# Patient Record
Sex: Female | Born: 1960 | Hispanic: No | Marital: Married | Smoking: Never smoker
Health system: Southern US, Community
[De-identification: ages and names within clinical notes are randomized; demographics above are authoritative.]

## PROBLEM LIST (undated history)

## (undated) DIAGNOSIS — I10 Essential (primary) hypertension: Secondary | ICD-10-CM

## (undated) HISTORY — DX: Essential (primary) hypertension: I10

## (undated) HISTORY — PX: DILATION AND CURETTAGE OF UTERUS: SHX78

---

## 1999-04-22 ENCOUNTER — Other Ambulatory Visit: Admission: RE | Admit: 1999-04-22 | Discharge: 1999-04-22 | Payer: Self-pay | Admitting: Obstetrics and Gynecology

## 1999-09-22 ENCOUNTER — Encounter (INDEPENDENT_AMBULATORY_CARE_PROVIDER_SITE_OTHER): Payer: Self-pay | Admitting: *Deleted

## 1999-09-22 ENCOUNTER — Ambulatory Visit (HOSPITAL_COMMUNITY): Admission: RE | Admit: 1999-09-22 | Discharge: 1999-09-22 | Payer: Self-pay | Admitting: General Surgery

## 2001-05-24 ENCOUNTER — Other Ambulatory Visit: Admission: RE | Admit: 2001-05-24 | Discharge: 2001-05-24 | Payer: Self-pay | Admitting: Obstetrics and Gynecology

## 2002-05-02 ENCOUNTER — Encounter: Payer: Self-pay | Admitting: Obstetrics and Gynecology

## 2002-05-02 ENCOUNTER — Encounter: Admission: RE | Admit: 2002-05-02 | Discharge: 2002-05-02 | Payer: Self-pay | Admitting: Obstetrics and Gynecology

## 2003-08-17 ENCOUNTER — Other Ambulatory Visit: Admission: RE | Admit: 2003-08-17 | Discharge: 2003-08-17 | Payer: Self-pay | Admitting: Obstetrics and Gynecology

## 2004-05-01 ENCOUNTER — Emergency Department (HOSPITAL_COMMUNITY): Admission: EM | Admit: 2004-05-01 | Discharge: 2004-05-01 | Payer: Self-pay | Admitting: Family Medicine

## 2005-05-01 ENCOUNTER — Other Ambulatory Visit: Admission: RE | Admit: 2005-05-01 | Discharge: 2005-05-01 | Payer: Self-pay | Admitting: Obstetrics and Gynecology

## 2008-10-27 ENCOUNTER — Ambulatory Visit: Payer: Self-pay | Admitting: Internal Medicine

## 2008-11-03 ENCOUNTER — Ambulatory Visit: Payer: Self-pay | Admitting: Internal Medicine

## 2009-02-23 ENCOUNTER — Ambulatory Visit: Payer: Self-pay | Admitting: Internal Medicine

## 2009-02-24 ENCOUNTER — Ambulatory Visit: Payer: Self-pay | Admitting: Internal Medicine

## 2010-10-21 ENCOUNTER — Ambulatory Visit
Admission: RE | Admit: 2010-10-21 | Discharge: 2010-10-21 | Payer: Self-pay | Source: Home / Self Care | Attending: Internal Medicine | Admitting: Internal Medicine

## 2010-10-21 ENCOUNTER — Ambulatory Visit: Admit: 2010-10-21 | Payer: Self-pay | Admitting: Internal Medicine

## 2011-04-19 ENCOUNTER — Encounter: Payer: Self-pay | Admitting: Internal Medicine

## 2011-04-21 ENCOUNTER — Encounter: Payer: Self-pay | Admitting: Internal Medicine

## 2011-04-22 NOTE — Progress Notes (Signed)
  Subjective:    Patient ID: Deanna Randolph, female    DOB: Sep 22, 1961, 50 y.o.   MRN: [id number]  HPI    Review of Systems     Objective:   Physical Exam        Assessment & Plan:

## 2011-04-25 ENCOUNTER — Ambulatory Visit: Payer: Self-pay | Admitting: Internal Medicine

## 2011-08-08 ENCOUNTER — Other Ambulatory Visit: Payer: Self-pay | Admitting: Orthopedic Surgery

## 2011-08-08 DIAGNOSIS — M25561 Pain in right knee: Secondary | ICD-10-CM

## 2011-08-12 ENCOUNTER — Ambulatory Visit
Admission: RE | Admit: 2011-08-12 | Discharge: 2011-08-12 | Disposition: A | Discharge status: Home / Self Care | Payer: BC Managed Care – PPO | Source: Ambulatory Visit | Attending: Orthopedic Surgery | Admitting: Orthopedic Surgery

## 2011-08-12 DIAGNOSIS — M25561 Pain in right knee: Secondary | ICD-10-CM

## 2011-08-14 ENCOUNTER — Encounter (HOSPITAL_COMMUNITY): Admission: RE | Payer: Self-pay | Source: Ambulatory Visit

## 2011-08-14 ENCOUNTER — Inpatient Hospital Stay (HOSPITAL_COMMUNITY): Admission: RE | Admit: 2011-08-14 | Payer: Self-pay | Source: Ambulatory Visit | Admitting: Obstetrics and Gynecology

## 2011-08-14 SURGERY — HYSTERECTOMY, ABDOMINAL
Anesthesia: Choice

## 2011-09-16 ENCOUNTER — Other Ambulatory Visit: Payer: Self-pay | Admitting: Internal Medicine

## 2011-11-10 ENCOUNTER — Telehealth: Payer: Self-pay | Admitting: Internal Medicine

## 2011-11-10 MED ORDER — CYCLOBENZAPRINE HCL 10 MG PO TABS: 10.0000 mg | ORAL_TABLET | Freq: Three times a day (TID) | ORAL | Status: DC | PRN

## 2011-11-10 NOTE — Telephone Encounter (Signed)
Call in generic Flexeril 10mg  #30 with directions 1/2-1po tid prn with no refill.

## 2011-11-10 NOTE — Telephone Encounter (Signed)
Rx for Flexeril sent to patient's pharmacy. messagee left for her

## 2011-12-12 ENCOUNTER — Other Ambulatory Visit: Payer: Self-pay

## 2011-12-12 MED ORDER — RAMIPRIL 5 MG PO CAPS: 5.0000 mg | ORAL_CAPSULE | Freq: Every day | ORAL | Status: DC

## 2012-01-01 ENCOUNTER — Telehealth: Payer: Self-pay | Admitting: Internal Medicine

## 2012-01-01 DIAGNOSIS — I10 Essential (primary) hypertension: Secondary | ICD-10-CM

## 2012-01-01 NOTE — Telephone Encounter (Signed)
Patient called last evening around 6 PM. Around 1 PM she ate a meal it [employer] consisting of chicken & a glass of wine. Shortly thereafter within 30 minutes or so she felt epigastric pain. Did not vomit. Initially was not nauseated. Husband who is physician gave her Prilosec, Pepto-Bismol, ginger ale. She vomited the Pepto-Bismol and ginger ale. Pain was intermittent. At times, she felt well enough to walk her dog and then was pain would return. No diarrhea. Pain was not in right upper quadrant but was described to me as epigastric. Seemed  like something fairly acute in onset after eating according to her husband. Patient said she was in a lot of distress when she called. I recommended that she go to urgent care to get evaluated. Patient denies being constipated. Had no fever or chills. We will call her today and see how she is doing. Addendum: We called and left a message. No answer at home.

## 2012-04-17 ENCOUNTER — Ambulatory Visit (HOSPITAL_COMMUNITY): Payer: BC Managed Care – PPO | Admitting: Anesthesiology

## 2012-04-17 ENCOUNTER — Ambulatory Visit (HOSPITAL_COMMUNITY)
Admission: RE | Admit: 2012-04-17 | Discharge: 2012-04-17 | Disposition: A | Discharge status: Home / Self Care | Payer: BC Managed Care – PPO | Source: Ambulatory Visit | Attending: Obstetrics and Gynecology | Admitting: Obstetrics and Gynecology

## 2012-04-17 ENCOUNTER — Encounter (HOSPITAL_COMMUNITY): Payer: Self-pay | Admitting: *Deleted

## 2012-04-17 ENCOUNTER — Encounter (HOSPITAL_COMMUNITY): Payer: Self-pay | Admitting: Anesthesiology

## 2012-04-17 ENCOUNTER — Encounter (HOSPITAL_COMMUNITY)
Admission: RE | Disposition: A | Discharge status: Home / Self Care | Payer: Self-pay | Source: Ambulatory Visit | Attending: Obstetrics and Gynecology

## 2012-04-17 DIAGNOSIS — I10 Essential (primary) hypertension: Secondary | ICD-10-CM

## 2012-04-17 DIAGNOSIS — N938 Other specified abnormal uterine and vaginal bleeding: Secondary | ICD-10-CM | POA: Insufficient documentation

## 2012-04-17 DIAGNOSIS — N949 Unspecified condition associated with female genital organs and menstrual cycle: Secondary | ICD-10-CM | POA: Insufficient documentation

## 2012-04-17 HISTORY — PX: DILATION AND CURETTAGE OF UTERUS: SHX78

## 2012-04-17 LAB — CBC
HCT: 36.7 % (ref 36.0–46.0)
Hemoglobin: 12.1 g/dL (ref 12.0–15.0)
MCV: 85 fL (ref 78.0–100.0)
RBC: 4.32 MIL/uL (ref 3.87–5.11)
RDW: 12.9 % (ref 11.5–15.5)
WBC: 10.1 10*3/uL (ref 4.0–10.5)

## 2012-04-17 SURGERY — DILATION AND CURETTAGE
Anesthesia: Monitor Anesthesia Care | Site: Vagina | Wound class: Clean Contaminated

## 2012-04-17 MED ORDER — FENTANYL CITRATE 0.05 MG/ML IJ SOLN
INTRAMUSCULAR | Status: DC | PRN
  Administered 2012-04-17: 100 ug via INTRAVENOUS

## 2012-04-17 MED ORDER — MEPERIDINE HCL 25 MG/ML IJ SOLN: 6.2500 mg | INTRAMUSCULAR | Status: DC | PRN

## 2012-04-17 MED ORDER — LACTATED RINGERS IV SOLN
INTRAVENOUS | Status: DC
  Administered 2012-04-17 (×2): via INTRAVENOUS

## 2012-04-17 MED ORDER — MIDAZOLAM HCL 5 MG/5ML IJ SOLN
INTRAMUSCULAR | Status: DC | PRN
  Administered 2012-04-17: 2 mg via INTRAVENOUS

## 2012-04-17 MED ORDER — CEFAZOLIN SODIUM 1-5 GM-% IV SOLN
1.0000 g | INTRAVENOUS | Status: AC
  Administered 2012-04-17: 1 g via INTRAVENOUS

## 2012-04-17 MED ORDER — ONDANSETRON HCL 4 MG/2ML IJ SOLN: 4.0000 mg | Freq: Once | INTRAMUSCULAR | Status: DC | PRN

## 2012-04-17 MED ORDER — PROPOFOL 10 MG/ML IV EMUL
INTRAVENOUS | Status: DC | PRN
  Administered 2012-04-17: 100 ug/kg/min via INTRAVENOUS

## 2012-04-17 MED ORDER — LIDOCAINE HCL 1 % IJ SOLN
INTRAMUSCULAR | Status: DC | PRN
  Administered 2012-04-17: 10 mL

## 2012-04-17 MED ORDER — KETOROLAC TROMETHAMINE 30 MG/ML IJ SOLN: 15.0000 mg | Freq: Once | INTRAMUSCULAR | Status: DC | PRN

## 2012-04-17 MED ORDER — KETOROLAC TROMETHAMINE 30 MG/ML IJ SOLN
INTRAMUSCULAR | Status: DC | PRN
  Administered 2012-04-17: 30 mg via INTRAVENOUS

## 2012-04-17 MED ORDER — FENTANYL CITRATE 0.05 MG/ML IJ SOLN
25.0000 ug | INTRAMUSCULAR | Status: DC | PRN
  Administered 2012-04-17: 50 ug via INTRAVENOUS

## 2012-04-17 MED ORDER — ONDANSETRON HCL 4 MG/2ML IJ SOLN
INTRAMUSCULAR | Status: DC | PRN
  Administered 2012-04-17: 4 mg via INTRAVENOUS

## 2012-04-17 MED ORDER — PROPOFOL 10 MG/ML IV EMUL
INTRAVENOUS | Status: DC | PRN
  Administered 2012-04-17 (×2): 20 mg via INTRAVENOUS
  Administered 2012-04-17: 50 mg via INTRAVENOUS

## 2012-04-17 MED ORDER — LIDOCAINE HCL (CARDIAC) 20 MG/ML IV SOLN
INTRAVENOUS | Status: DC | PRN
  Administered 2012-04-17: 60 mg via INTRAVENOUS

## 2012-04-17 SURGICAL SUPPLY — 11 items
CATH ROBINSON RED A/P 16FR (CATHETERS) ×2 IMPLANT
CLOTH BEACON ORANGE TIMEOUT ST (SAFETY) ×2 IMPLANT
CONT PATH 16OZ SNAP LID 3702 (MISCELLANEOUS) ×2 IMPLANT
GLOVE BIO SURGEON STRL SZ 6.5 (GLOVE) ×4 IMPLANT
GOWN PREVENTION PLUS LG XLONG (DISPOSABLE) ×4 IMPLANT
NEEDLE SPNL 22GX3.5 QUINCKE BK (NEEDLE) ×2 IMPLANT
PACK VAGINAL MINOR WOMEN LF (CUSTOM PROCEDURE TRAY) ×2 IMPLANT
PAD PREP 24X48 CUFFED NSTRL (MISCELLANEOUS) ×2 IMPLANT
SYR CONTROL 10ML LL (SYRINGE) ×2 IMPLANT
TOWEL OR 17X24 6PK STRL BLUE (TOWEL DISPOSABLE) ×4 IMPLANT
WATER STERILE IRR 1000ML POUR (IV SOLUTION) ×2 IMPLANT

## 2012-04-17 NOTE — Transfer of Care (Signed)
Immediate Anesthesia Transfer of Care Note  Patient: Deanna Randolph  Procedure(s) Performed: Procedure(s) (LRB): DILATATION AND CURETTAGE (N/A)  Patient Location: PACU  Anesthesia Type: MAC  Level of Consciousness: awake  Airway & Oxygen Therapy: Patient Spontanous Breathing and Patient connected to nasal cannula oxygen  Post-op Assessment: Report given to PACU RN and Post -op Vital signs reviewed and stable  Post vital signs: stable  Complications: No apparent anesthesia complications

## 2012-04-17 NOTE — Anesthesia Postprocedure Evaluation (Signed)
Anesthesia Post Note  Patient: Deanna Randolph  Procedure(s) Performed: Procedure(s) (LRB): DILATATION AND CURETTAGE (N/A)  Anesthesia type: MAC  Patient location: PACU  Post pain: Pain level controlled  Post assessment: Post-op Vital signs reviewed  Last Vitals:  Filed Vitals:   04/17/12 1338  BP: 149/73  Pulse: 72  Temp: 37.1 C  Resp: 16    Post vital signs: Reviewed  Level of consciousness: sedated  Complications: No apparent anesthesia complications

## 2012-04-17 NOTE — Anesthesia Preprocedure Evaluation (Signed)
Anesthesia Evaluation  Patient identified by MRN, date of birth, ID band Patient awake    Reviewed: Allergy & Precautions, H&P , NPO status , Patient's Chart, lab work & pertinent test results  Airway Mallampati: III TM Distance: >3 FB Neck ROM: full    Dental No notable dental hx. (+) Teeth Intact   Pulmonary neg pulmonary ROS,    Pulmonary exam normal       Cardiovascular     Neuro/Psych negative neurological ROS  negative psych ROS   GI/Hepatic negative GI ROS, Neg liver ROS,   Endo/Other  negative endocrine ROS  Renal/GU negative Renal ROS  negative genitourinary   Musculoskeletal negative musculoskeletal ROS (+)   Abdominal Normal abdominal exam  (+)   Peds negative pediatric ROS (+)  Hematology negative hematology ROS (+)   Anesthesia Other Findings   Reproductive/Obstetrics negative OB ROS                           Anesthesia Physical Anesthesia Plan  ASA: II  Anesthesia Plan: MAC   Post-op Pain Management:    Induction: Intravenous  Airway Management Planned:   Additional Equipment:   Intra-op Plan:   Post-operative Plan:   Informed Consent: I have reviewed the patients History and Physical, chart, labs and discussed the procedure including the risks, benefits and alternatives for the proposed anesthesia with the patient or authorized representative who has indicated his/her understanding and acceptance.     Plan Discussed with: CRNA and Surgeon  Anesthesia Plan Comments:         Anesthesia Quick Evaluation

## 2012-04-17 NOTE — Anesthesia Procedure Notes (Signed)
Epidural Patient location during procedure: OB Start time: 04/17/2012 2:58 PM End time: 04/17/2012 3:02 PM Reason for block: procedure for pain  Staffing Anesthesiologist: Sandrea Hughs Performed by: anesthesiologist   Preanesthetic Checklist Completed: patient identified, site marked, surgical consent, pre-op evaluation, timeout performed, IV checked, risks and benefits discussed and monitors and equipment checked  Epidural Patient position: sitting Prep: site prepped and draped and DuraPrep Patient monitoring: continuous pulse ox and blood pressure Approach: midline Injection technique: LOR air  Needle:  Needle type: Tuohy  Needle gauge: 17 G Needle length: 9 cm Needle insertion depth: 6 cm Catheter type: closed end flexible Catheter size: 19 Gauge Catheter at skin depth: 11 cm Test dose: negative and Other  Assessment Sensory level: T8 Events: blood not aspirated, injection not painful, no injection resistance, negative IV test and no paresthesia

## 2012-04-17 NOTE — H&P (Signed)
51 year old female here for emergent D and C. She started having bleeding over a week ago and last night woke up in a puddle of blood. Passing large amount of clots. Unable to perform ultrasound in office today because of discomfort. Patient has not had a cycle since approximately November.  Med Hx Hypertension  Afebrile Vital Signs Stable General alert and oriented Lung CTAB Car RRR Pelvic heavy bleeding with clots in vagina  Hemoglobin in office 11.8  IMPRESSION: Abnormal uterine bleeding  PLAN: D and C Risks reviewed with patient

## 2012-04-17 NOTE — Op Note (Signed)
Deanna Randolph, HEBDON NO.:  [id number]  MEDICAL RECORD NO.:  [id number]  LOCATION:  [facility]                          FACILITY:  [facility]  PHYSICIAN:  Herman Fiero L. Omauri Boeve, M.D.DATE OF BIRTH:  1961-06-25  DATE OF PROCEDURE:  04/17/2012 DATE OF DISCHARGE:  04/17/2012                              OPERATIVE REPORT   PREOPERATIVE DIAGNOSIS:  Abnormal uterine bleeding.  POSTOP DIAGNOSIS:  Abnormal uterine bleeding.  PROCEDURE:  D and C.  SURGEON:  Kalev Temme L. Lovell Nuttall, MD  ANESTHESIA:  IV sedation with MAC with paracervical block.  COMPLICATIONS:  None.  EBL:  Minimal.  PROCEDURE:  The patient was consented and taken to the operating room. She was prepped and draped.  In and out catheter was used to empty the bladder.  The speculum was then turned to the vagina.  The cervix was grasped with a tenaculum.  There was a moderate amount of blood in the vagina.  Paracervical block was performed in standard fashion and the cervix was gently dilated using Pratt dilators.  A sharp curette was inserted.  The uterus was thoroughly curetted of tissue which was moderate in amount with some clots.  All tissue was sent to pathology. At the end of the procedure, minimal bleeding was noted.  All sponges were removed from the vagina.  All instrument counts were correct x2. The patient went to recovery room in stable condition, and she will be called with the pathology in a few days.     Acheron Sugg L. Vincente Poli, M.D.     Florestine Avers  D:  04/17/2012  T:  04/17/2012  Job:  [id number]

## 2012-04-17 NOTE — Discharge Instructions (Signed)

## 2012-04-17 NOTE — Brief Op Note (Signed)
04/17/2012  2:50 PM  PATIENT:  Deanna Randolph  51 y.o. female  PRE-OPERATIVE DIAGNOSIS:  Abnormal uterine bleeding  POST-OPERATIVE DIAGNOSIS:  same  PROCEDURE:  Procedure(s) (LRB): DILATATION AND CURETTAGE (N/A)  SURGEON:  Surgeon(s) and Role:    * Jeani Hawking, MD - Primary  PHYSICIAN ASSISTANT:   ASSISTANTS: none   ANESTHESIA:   local and IV sedation  EBL:  Total I/O In: 500 [I.V.:500] Out: -   BLOOD ADMINISTERED:none  DRAINS: none   LOCAL MEDICATIONS USED:  LIDOCAINE   SPECIMEN:  Source of Specimen:  uterine curettings  DISPOSITION OF SPECIMEN:  PATHOLOGY  COUNTS:  YES  TOURNIQUET:  * No tourniquets in log *  DICTATION: .Other Dictation: Dictation Number [id number]  PLAN OF CARE: Discharge to home after PACU  PATIENT DISPOSITION:  PACU - hemodynamically stable.   Delay start of Pharmacological VTE agent (>24hrs) due to surgical blood loss or risk of bleeding: not applicable

## 2012-04-18 ENCOUNTER — Encounter (HOSPITAL_COMMUNITY): Payer: Self-pay | Admitting: Obstetrics and Gynecology

## 2012-05-22 ENCOUNTER — Encounter (HOSPITAL_COMMUNITY): Payer: Self-pay | Admitting: Pharmacist

## 2012-05-29 ENCOUNTER — Encounter (HOSPITAL_COMMUNITY): Payer: Self-pay

## 2012-05-29 ENCOUNTER — Encounter (HOSPITAL_COMMUNITY)
Admission: RE | Admit: 2012-05-29 | Discharge: 2012-05-29 | Disposition: A | Discharge status: Home / Self Care | Payer: BC Managed Care – PPO | Source: Ambulatory Visit | Attending: Obstetrics and Gynecology | Admitting: Obstetrics and Gynecology

## 2012-05-29 LAB — BASIC METABOLIC PANEL
Chloride: 102 mEq/L (ref 96–112)
GFR calc Af Amer: >90 mL/min (ref >90)
Potassium: 4.4 mEq/L (ref 3.5–5.1)

## 2012-05-29 LAB — CBC
Platelets: 236 10*3/uL (ref 150–400)
RBC: 4.31 MIL/uL (ref 3.87–5.11)
RDW: 12.7 % (ref 11.5–15.5)
WBC: 6.6 10*3/uL (ref 4.0–10.5)

## 2012-05-29 LAB — SURGICAL PCR SCREEN
MRSA, PCR: NEGATIVE
Staphylococcus aureus: NEGATIVE

## 2012-05-29 NOTE — Patient Instructions (Addendum)
20 Deanna Randolph  05/29/2012   Your procedure is scheduled on:  06/03/12  Enter through the Main Entrance of [facility] at 6 AM.  Pick up the phone at the desk and dial [phone].   Call this number if you have problems the morning of surgery: [phone]   Remember:   Do not eat food:After Midnight.  Do not drink clear liquids: After Midnight.  Take these medicines the morning of surgery with A SIP OF WATER: Blood pressure medication   Do not wear jewelry, make-up or nail polish.  Do not wear lotions, powders, or perfumes. You may wear deodorant.  Do not shave 48 hours prior to surgery.  Do not bring valuables to the hospital.  Contacts, dentures or bridgework may not be worn into surgery.  Leave suitcase in the car. After surgery it may be brought to your room.  For patients admitted to the hospital, checkout time is 11:00 AM the day of discharge.   Patients discharged the day of surgery will not be allowed to drive home.  Name and phone number of your driver: NA  Special Instructions: CHG Shower Use Special Wash: 1/2 bottle night before surgery and 1/2 bottle morning of surgery.   Please read over the following fact sheets that you were given: MRSA Information

## 2012-06-02 NOTE — H&P (Signed)
51 year old G 1 P 1 with abnormal uterine bleeding here for TAHBSO.  She had an episode of heavy vaginal bleeding requiring emergency D and C. Pathology was benign. Vaginal bleeding returned and responded to Lysteda.  Medical history Hypertension  Surgical history D and C  Meds  Antihypertensive  NKDA  Social history  no tobacco or alcohol  Afebrile Vital signs stable General alert and oriented Lung CTAB Car RRR Pelvic Is normal  Impression Abnormal uterine bleeding  Plan TAHBSO

## 2012-06-03 ENCOUNTER — Inpatient Hospital Stay (HOSPITAL_COMMUNITY): Payer: BC Managed Care – PPO | Admitting: Anesthesiology

## 2012-06-03 ENCOUNTER — Encounter (HOSPITAL_COMMUNITY): Payer: Self-pay | Admitting: Anesthesiology

## 2012-06-03 ENCOUNTER — Encounter (HOSPITAL_COMMUNITY)
Admission: RE | Disposition: A | Discharge status: Home / Self Care | Payer: Self-pay | Source: Ambulatory Visit | Attending: Obstetrics and Gynecology

## 2012-06-03 ENCOUNTER — Inpatient Hospital Stay (HOSPITAL_COMMUNITY)
Admission: RE | Admit: 2012-06-03 | Discharge: 2012-06-05 | DRG: 359 | Disposition: A | Discharge status: Home / Self Care | Payer: BC Managed Care – PPO | Source: Ambulatory Visit | Attending: Obstetrics and Gynecology | Admitting: Obstetrics and Gynecology

## 2012-06-03 ENCOUNTER — Inpatient Hospital Stay (HOSPITAL_COMMUNITY)
Admission: RE | Admit: 2012-06-03 | Payer: BC Managed Care – PPO | Source: Ambulatory Visit | Admitting: Obstetrics and Gynecology

## 2012-06-03 ENCOUNTER — Encounter (HOSPITAL_COMMUNITY): Admission: RE | Payer: Self-pay | Source: Ambulatory Visit

## 2012-06-03 ENCOUNTER — Encounter (HOSPITAL_COMMUNITY): Payer: Self-pay

## 2012-06-03 DIAGNOSIS — D251 Intramural leiomyoma of uterus: Secondary | ICD-10-CM | POA: Diagnosis present

## 2012-06-03 DIAGNOSIS — N949 Unspecified condition associated with female genital organs and menstrual cycle: Principal | ICD-10-CM | POA: Diagnosis present

## 2012-06-03 DIAGNOSIS — N803 Endometriosis of pelvic peritoneum, unspecified: Secondary | ICD-10-CM | POA: Diagnosis present

## 2012-06-03 DIAGNOSIS — N809 Endometriosis, unspecified: Secondary | ICD-10-CM

## 2012-06-03 DIAGNOSIS — N801 Endometriosis of ovary: Secondary | ICD-10-CM | POA: Diagnosis present

## 2012-06-03 DIAGNOSIS — N80109 Endometriosis of ovary, unspecified side, unspecified depth: Secondary | ICD-10-CM | POA: Diagnosis present

## 2012-06-03 DIAGNOSIS — N938 Other specified abnormal uterine and vaginal bleeding: Principal | ICD-10-CM | POA: Diagnosis present

## 2012-06-03 DIAGNOSIS — I10 Essential (primary) hypertension: Secondary | ICD-10-CM

## 2012-06-03 HISTORY — PX: SALPINGOOPHORECTOMY: SHX82

## 2012-06-03 HISTORY — PX: ABDOMINAL HYSTERECTOMY: SHX81

## 2012-06-03 LAB — CBC
HCT: 31.8 % — ABNORMAL LOW (ref 36.0–46.0)
Hemoglobin: 10.4 g/dL — ABNORMAL LOW (ref 12.0–15.0)
MCV: 85.5 fL (ref 78.0–100.0)
RDW: 12.8 % (ref 11.5–15.5)
WBC: 16.9 10*3/uL — ABNORMAL HIGH (ref 4.0–10.5)

## 2012-06-03 LAB — BASIC METABOLIC PANEL
BUN: 8 mg/dL (ref 6–23)
CO2: 27 mEq/L (ref 19–32)
Chloride: 104 mEq/L (ref 96–112)
Creatinine, Ser: 0.57 mg/dL (ref 0.50–1.10)
Glucose, Bld: 156 mg/dL — ABNORMAL HIGH (ref 70–99)
Potassium: 3.7 mEq/L (ref 3.5–5.1)

## 2012-06-03 SURGERY — HYSTERECTOMY, ABDOMINAL
Anesthesia: Choice

## 2012-06-03 SURGERY — HYSTERECTOMY, ABDOMINAL
Anesthesia: General | Site: Abdomen | Laterality: Bilateral

## 2012-06-03 MED ORDER — FENTANYL CITRATE 0.05 MG/ML IJ SOLN
25.0000 ug | INTRAMUSCULAR | Status: DC | PRN
  Administered 2012-06-03 (×2): 50 ug via INTRAVENOUS

## 2012-06-03 MED ORDER — FENTANYL CITRATE 0.05 MG/ML IJ SOLN
INTRAMUSCULAR | Status: AC
  Filled 2012-06-03: qty 5

## 2012-06-03 MED ORDER — HYDROMORPHONE HCL PF 1 MG/ML IJ SOLN
INTRAMUSCULAR | Status: AC
  Administered 2012-06-03: 0.5 mg via INTRAVENOUS
  Filled 2012-06-03: qty 1

## 2012-06-03 MED ORDER — ONDANSETRON HCL 4 MG/2ML IJ SOLN
INTRAMUSCULAR | Status: AC
  Filled 2012-06-03: qty 2

## 2012-06-03 MED ORDER — IBUPROFEN 600 MG PO TABS
600.0000 mg | ORAL_TABLET | Freq: Four times a day (QID) | ORAL | Status: DC | PRN
  Administered 2012-06-04 – 2012-06-05 (×4): 600 mg via ORAL
  Filled 2012-06-03 (×4): qty 1

## 2012-06-03 MED ORDER — FENTANYL CITRATE 0.05 MG/ML IJ SOLN
INTRAMUSCULAR | Status: AC
  Administered 2012-06-03: 50 ug via INTRAVENOUS
  Filled 2012-06-03: qty 2

## 2012-06-03 MED ORDER — CEFAZOLIN SODIUM-DEXTROSE 2-3 GM-% IV SOLR
2.0000 g | INTRAVENOUS | Status: AC
  Administered 2012-06-03: 2 g via INTRAVENOUS

## 2012-06-03 MED ORDER — DIPHENHYDRAMINE HCL 50 MG/ML IJ SOLN: 12.5000 mg | Freq: Four times a day (QID) | INTRAMUSCULAR | Status: DC | PRN

## 2012-06-03 MED ORDER — RAMIPRIL 5 MG PO CAPS
5.0000 mg | ORAL_CAPSULE | Freq: Every day | ORAL | Status: DC
  Administered 2012-06-04 – 2012-06-05 (×2): 5 mg via ORAL
  Filled 2012-06-03 (×3): qty 1

## 2012-06-03 MED ORDER — PROMETHAZINE HCL 25 MG/ML IJ SOLN: 6.2500 mg | INTRAMUSCULAR | Status: DC | PRN

## 2012-06-03 MED ORDER — TEMAZEPAM 15 MG PO CAPS: 15.0000 mg | ORAL_CAPSULE | Freq: Every evening | ORAL | Status: DC | PRN

## 2012-06-03 MED ORDER — DEXAMETHASONE SODIUM PHOSPHATE 4 MG/ML IJ SOLN
INTRAMUSCULAR | Status: DC | PRN
  Administered 2012-06-03: 10 mg via INTRAVENOUS

## 2012-06-03 MED ORDER — PROPOFOL 10 MG/ML IV EMUL
INTRAVENOUS | Status: DC | PRN
  Administered 2012-06-03: 200 mg via INTRAVENOUS

## 2012-06-03 MED ORDER — DIPHENHYDRAMINE HCL 12.5 MG/5ML PO ELIX: 12.5000 mg | ORAL_SOLUTION | Freq: Four times a day (QID) | ORAL | Status: DC | PRN

## 2012-06-03 MED ORDER — ROCURONIUM BROMIDE 100 MG/10ML IV SOLN
INTRAVENOUS | Status: DC | PRN
  Administered 2012-06-03: 40 mg via INTRAVENOUS
  Administered 2012-06-03: 5 mg via INTRAVENOUS

## 2012-06-03 MED ORDER — KETOROLAC TROMETHAMINE 30 MG/ML IJ SOLN: 15.0000 mg | Freq: Once | INTRAMUSCULAR | Status: DC | PRN

## 2012-06-03 MED ORDER — GLYCOPYRROLATE 0.2 MG/ML IJ SOLN
INTRAMUSCULAR | Status: AC
  Filled 2012-06-03: qty 1

## 2012-06-03 MED ORDER — NEOSTIGMINE METHYLSULFATE 1 MG/ML IJ SOLN
INTRAMUSCULAR | Status: DC | PRN
  Administered 2012-06-03: 5 mg via INTRAVENOUS

## 2012-06-03 MED ORDER — HYDROMORPHONE 0.3 MG/ML IV SOLN
INTRAVENOUS | Status: DC
  Administered 2012-06-03: 10:00:00 via INTRAVENOUS
  Administered 2012-06-03: 2.99 mg via INTRAVENOUS
  Administered 2012-06-04: 02:00:00 via INTRAVENOUS
  Filled 2012-06-03 (×2): qty 25

## 2012-06-03 MED ORDER — BUPIVACAINE HCL (PF) 0.25 % IJ SOLN
INTRAMUSCULAR | Status: AC
  Filled 2012-06-03: qty 30

## 2012-06-03 MED ORDER — BUPIVACAINE HCL (PF) 0.25 % IJ SOLN
INTRAMUSCULAR | Status: DC | PRN
  Administered 2012-06-03: 10 mL

## 2012-06-03 MED ORDER — GLYCOPYRROLATE 0.2 MG/ML IJ SOLN
INTRAMUSCULAR | Status: AC
  Filled 2012-06-03: qty 3

## 2012-06-03 MED ORDER — KETOROLAC TROMETHAMINE 30 MG/ML IJ SOLN: 30.0000 mg | Freq: Four times a day (QID) | INTRAMUSCULAR | Status: DC

## 2012-06-03 MED ORDER — LIDOCAINE HCL (CARDIAC) 20 MG/ML IV SOLN
INTRAVENOUS | Status: AC
  Filled 2012-06-03: qty 5

## 2012-06-03 MED ORDER — ONDANSETRON HCL 4 MG/2ML IJ SOLN: 4.0000 mg | Freq: Four times a day (QID) | INTRAMUSCULAR | Status: DC | PRN

## 2012-06-03 MED ORDER — ONDANSETRON HCL 4 MG/2ML IJ SOLN
INTRAMUSCULAR | Status: DC | PRN
  Administered 2012-06-03: 4 mg via INTRAVENOUS

## 2012-06-03 MED ORDER — LIDOCAINE HCL (CARDIAC) 20 MG/ML IV SOLN
INTRAVENOUS | Status: DC | PRN
  Administered 2012-06-03: 10 mg via INTRAVENOUS

## 2012-06-03 MED ORDER — NEOSTIGMINE METHYLSULFATE 1 MG/ML IJ SOLN
INTRAMUSCULAR | Status: AC
  Filled 2012-06-03: qty 10

## 2012-06-03 MED ORDER — MIDAZOLAM HCL 2 MG/2ML IJ SOLN
INTRAMUSCULAR | Status: AC
  Filled 2012-06-03: qty 2

## 2012-06-03 MED ORDER — GLYCOPYRROLATE 0.2 MG/ML IJ SOLN
INTRAMUSCULAR | Status: DC | PRN
  Administered 2012-06-03: 0.2 mg via INTRAVENOUS
  Administered 2012-06-03: 1 mg via INTRAVENOUS

## 2012-06-03 MED ORDER — MEPERIDINE HCL 25 MG/ML IJ SOLN: 6.2500 mg | INTRAMUSCULAR | Status: DC | PRN

## 2012-06-03 MED ORDER — MENTHOL 3 MG MT LOZG: 1.0000 | LOZENGE | OROMUCOSAL | Status: DC | PRN

## 2012-06-03 MED ORDER — LIDOCAINE HCL (CARDIAC) 20 MG/ML IV SOLN: INTRAVENOUS | Status: DC | PRN

## 2012-06-03 MED ORDER — KETOROLAC TROMETHAMINE 30 MG/ML IJ SOLN
INTRAMUSCULAR | Status: AC
  Filled 2012-06-03: qty 1

## 2012-06-03 MED ORDER — KETOROLAC TROMETHAMINE 30 MG/ML IJ SOLN
INTRAMUSCULAR | Status: DC | PRN
  Administered 2012-06-03: 30 mg via INTRAVENOUS

## 2012-06-03 MED ORDER — LACTATED RINGERS IV SOLN
INTRAVENOUS | Status: DC
  Administered 2012-06-03 (×3): via INTRAVENOUS

## 2012-06-03 MED ORDER — HYDROMORPHONE HCL PF 1 MG/ML IJ SOLN
INTRAMUSCULAR | Status: AC
  Administered 2012-06-03: 1 mg
  Filled 2012-06-03: qty 1

## 2012-06-03 MED ORDER — PROPOFOL 10 MG/ML IV EMUL: INTRAVENOUS | Status: DC | PRN

## 2012-06-03 MED ORDER — NALOXONE HCL 0.4 MG/ML IJ SOLN: 0.4000 mg | INTRAMUSCULAR | Status: DC | PRN

## 2012-06-03 MED ORDER — TRAMADOL HCL 50 MG PO TABS: 50.0000 mg | ORAL_TABLET | Freq: Four times a day (QID) | ORAL | Status: DC | PRN

## 2012-06-03 MED ORDER — SODIUM CHLORIDE 0.9 % IJ SOLN
9.0000 mL | INTRAMUSCULAR | Status: DC | PRN
  Administered 2012-06-04: 3 mL via INTRAVENOUS

## 2012-06-03 MED ORDER — CEFAZOLIN SODIUM-DEXTROSE 2-3 GM-% IV SOLR
INTRAVENOUS | Status: AC
  Filled 2012-06-03: qty 50

## 2012-06-03 MED ORDER — DEXAMETHASONE SODIUM PHOSPHATE 10 MG/ML IJ SOLN
INTRAMUSCULAR | Status: AC
  Filled 2012-06-03: qty 1

## 2012-06-03 MED ORDER — ROCURONIUM BROMIDE 50 MG/5ML IV SOLN
INTRAVENOUS | Status: AC
  Filled 2012-06-03: qty 1

## 2012-06-03 MED ORDER — FENTANYL CITRATE 0.05 MG/ML IJ SOLN
INTRAMUSCULAR | Status: DC | PRN
  Administered 2012-06-03: 100 ug via INTRAVENOUS
  Administered 2012-06-03: 50 ug via INTRAVENOUS
  Administered 2012-06-03: 100 ug via INTRAVENOUS
  Administered 2012-06-03: 50 ug via INTRAVENOUS
  Administered 2012-06-03 (×2): 100 ug via INTRAVENOUS

## 2012-06-03 MED ORDER — DEXTROSE IN LACTATED RINGERS 5 % IV SOLN
INTRAVENOUS | Status: DC
  Administered 2012-06-03 (×2): via INTRAVENOUS

## 2012-06-03 MED ORDER — MIDAZOLAM HCL 5 MG/5ML IJ SOLN
INTRAMUSCULAR | Status: DC | PRN
  Administered 2012-06-03: 2 mg via INTRAVENOUS

## 2012-06-03 MED ORDER — PROPOFOL 10 MG/ML IV EMUL
INTRAVENOUS | Status: AC
  Filled 2012-06-03: qty 20

## 2012-06-03 MED ORDER — HYDROMORPHONE HCL PF 1 MG/ML IJ SOLN
0.2500 mg | INTRAMUSCULAR | Status: DC | PRN
  Administered 2012-06-03 (×4): 0.5 mg via INTRAVENOUS

## 2012-06-03 MED ORDER — MIDAZOLAM HCL 2 MG/2ML IJ SOLN: 0.5000 mg | Freq: Once | INTRAMUSCULAR | Status: DC | PRN

## 2012-06-03 SURGICAL SUPPLY — 37 items
ADH SKN CLS APL DERMABOND .7 (GAUZE/BANDAGES/DRESSINGS) ×2
BLADE SURG ROTATE 9660 (MISCELLANEOUS) ×3 IMPLANT
BRR ADH 6X5 SEPRAFILM 1 SHT (MISCELLANEOUS)
CANISTER SUCTION 2500CC (MISCELLANEOUS) ×3 IMPLANT
CHLORAPREP W/TINT 26ML (MISCELLANEOUS) ×3 IMPLANT
CLOTH BEACON ORANGE TIMEOUT ST (SAFETY) ×3 IMPLANT
DECANTER SPIKE VIAL GLASS SM (MISCELLANEOUS) IMPLANT
DERMABOND ADVANCED (GAUZE/BANDAGES/DRESSINGS) ×1
DERMABOND ADVANCED .7 DNX12 (GAUZE/BANDAGES/DRESSINGS) ×2 IMPLANT
GAUZE SPONGE 4X4 16PLY XRAY LF (GAUZE/BANDAGES/DRESSINGS) ×3 IMPLANT
GLOVE BIO SURGEON STRL SZ 6.5 (GLOVE) ×6 IMPLANT
GOWN PREVENTION PLUS LG XLONG (DISPOSABLE) ×9 IMPLANT
NEEDLE HYPO 22GX1.5 SAFETY (NEEDLE) ×3 IMPLANT
NEEDLE HYPO 25X1 1.5 SAFETY (NEEDLE) IMPLANT
NS IRRIG 1000ML POUR BTL (IV SOLUTION) ×3 IMPLANT
PACK ABDOMINAL GYN (CUSTOM PROCEDURE TRAY) ×3 IMPLANT
PAD OB MATERNITY 4.3X12.25 (PERSONAL CARE ITEMS) ×3 IMPLANT
PROTECTOR NERVE ULNAR (MISCELLANEOUS) ×3 IMPLANT
SEPRAFILM MEMBRANE 5X6 (MISCELLANEOUS) IMPLANT
SPONGE LAP 18X18 X RAY DECT (DISPOSABLE) ×3 IMPLANT
STAPLER VISISTAT 35W (STAPLE) ×3 IMPLANT
SUT PDS AB 0 CT 36 (SUTURE) IMPLANT
SUT PDS AB 0 CTX 60 (SUTURE) IMPLANT
SUT PLAIN 2 0 XLH (SUTURE) ×3 IMPLANT
SUT VIC AB 0 CT1 18XCR BRD8 (SUTURE) ×4 IMPLANT
SUT VIC AB 0 CT1 27 (SUTURE) ×8
SUT VIC AB 0 CT1 27XBRD ANBCTR (SUTURE) ×8 IMPLANT
SUT VIC AB 0 CT1 8-18 (SUTURE) ×4
SUT VIC AB 3-0 PS1 18 (SUTURE)
SUT VIC AB 3-0 PS1 18X BRD (SUTURE) IMPLANT
SUT VIC AB 4-0 KS 27 (SUTURE) ×3 IMPLANT
SUT VICRYL 0 TIES 12 18 (SUTURE) ×3 IMPLANT
SYR CONTROL 10ML LL (SYRINGE) IMPLANT
SYRINGE 10CC LL (SYRINGE) ×3 IMPLANT
TOWEL OR 17X24 6PK STRL BLUE (TOWEL DISPOSABLE) ×9 IMPLANT
TRAY FOLEY CATH 14FR (SET/KITS/TRAYS/PACK) ×3 IMPLANT
WATER STERILE IRR 1000ML POUR (IV SOLUTION) ×3 IMPLANT

## 2012-06-03 NOTE — Addendum Note (Signed)
Addendum  created 06/03/12 1413 by Lincoln Brigham, CRNA   Modules edited:Notes Section

## 2012-06-03 NOTE — Anesthesia Postprocedure Evaluation (Signed)
  Anesthesia Post-op Note  Patient: Deanna Randolph  Procedure(s) Performed: Procedure(s) (LRB): HYSTERECTOMY ABDOMINAL (Bilateral) SALPINGO OOPHERECTOMY (Bilateral)  Patient Location: [facility]  Anesthesia Type: General  Level of Consciousness: awake, alert  and oriented  Airway and Oxygen Therapy: Patient Spontanous Breathing  Post-op Pain: mild  Post-op Assessment: Patient's Cardiovascular Status Stable, Respiratory Function Stable, Patent Airway, No signs of Nausea or vomiting and Pain level controlled  Post-op Vital Signs: stable  Complications: No apparent anesthesia complications

## 2012-06-03 NOTE — Transfer of Care (Signed)
Immediate Anesthesia Transfer of Care Note  Patient: Deanna Randolph  Procedure(s) Performed: Procedure(s) (LRB): HYSTERECTOMY ABDOMINAL (Bilateral) SALPINGO OOPHERECTOMY (Bilateral)  Patient Location: PACU  Anesthesia Type: General  Level of Consciousness: awake, alert  and oriented  Airway & Oxygen Therapy: Patient Spontanous Breathing and Patient connected to nasal cannula oxygen  Post-op Assessment: Report given to PACU RN and Post -op Vital signs reviewed and stable  Post vital signs: Reviewed and stable  Complications: No apparent anesthesia complications

## 2012-06-03 NOTE — Brief Op Note (Signed)
06/03/2012  8:41 AM  PATIENT:  Jon Billings  51 y.o. female  PRE-OPERATIVE DIAGNOSIS:  Abnormal uterine bleeding  POST-OPERATIVE DIAGNOSIS:  Above, Moderate Endometriosis   PROCEDURE:  Procedure(s) (LRB): HYSTERECTOMY ABDOMINAL  Bilateral SALPINGO OOPHERECTOMY (Bilateral)  SURGEON:  Surgeon(s) and Role:    * Jeani Hawking, MD - Primary    * Mitchel Honour, DO - Assisting  PHYSICIAN ASSISTANT:   ASSISTANTS: none   ANESTHESIA:   general  EBL:  Total I/O In: 2000 [I.V.:2000] Out: 1150 [Urine:750; Blood:400]  BLOOD ADMINISTERED:none  DRAINS: Urinary Catheter (Foley)   LOCAL MEDICATIONS USED:  MARCAINE     SPECIMEN:  Source of Specimen:  uterus, cervix, tubes and ovaries  DISPOSITION OF SPECIMEN:  PATHOLOGY  COUNTS:  YES  TOURNIQUET:  * No tourniquets in log *  DICTATION: .Other Dictation: Dictation Number [id number]  PLAN OF CARE: Admit to inpatient   PATIENT DISPOSITION:  PACU - hemodynamically stable.   Delay start of Pharmacological VTE agent (>24hrs) due to surgical blood loss or risk of bleeding: not applicable

## 2012-06-03 NOTE — Anesthesia Postprocedure Evaluation (Signed)
  Anesthesia Post-op Note  Patient: Deanna Randolph  Procedure(s) Performed: Procedure(s) (LRB): HYSTERECTOMY ABDOMINAL (Bilateral) SALPINGO OOPHERECTOMY (Bilateral)  Patient is awake and responsive. Pain and nausea are reasonably well controlled. Vital signs are stable and clinically acceptable. Oxygen saturation is clinically acceptable. There are no apparent anesthetic complications at this time. Patient is ready for discharge.

## 2012-06-03 NOTE — Progress Notes (Signed)
History and physical on the chart. No significant changes Will proceed with TAH BSO

## 2012-06-03 NOTE — Anesthesia Procedure Notes (Signed)
Procedure Name: Intubation Date/Time: 06/03/2012 7:34 AM Performed by: Lincoln Brigham Pre-anesthesia Checklist: Patient identified, Timeout performed, Emergency Drugs available, Suction available and Patient being monitored Patient Re-evaluated:Patient Re-evaluated prior to inductionOxygen Delivery Method: Circle system utilized Preoxygenation: Pre-oxygenation with 100% oxygen Intubation Type: IV induction Ventilation: Mask ventilation without difficulty Grade View: Grade III Tube type: Oral Tube size: 7.0 mm Number of attempts: 2 Airway Equipment and Method: Video-laryngoscopy and Stylet Placement Confirmation: ETT inserted through vocal cords under direct vision,  positive ETCO2,  CO2 detector and breath sounds checked- equal and bilateral Secured at: 22 cm Tube secured with: Tape Dental Injury: Teeth and Oropharynx as per pre-operative assessment  Difficulty Due To: Difficulty was unanticipated and Difficult Airway-  due to edematous airway Comments: Easy intubation with Glide Scope

## 2012-06-03 NOTE — Anesthesia Preprocedure Evaluation (Addendum)
Anesthesia Evaluation  Patient identified by MRN, date of birth, ID band Patient awake    Reviewed: Allergy & Precautions, H&P , Patient's Chart, lab work & pertinent test results, reviewed documented beta blocker date and time   History of Anesthesia Complications Negative for: history of anesthetic complications  Airway Mallampati: III TM Distance: >3 FB Neck ROM: full  Mouth opening: Limited Mouth Opening  Dental No notable dental hx.    Pulmonary neg pulmonary ROS,  breath sounds clear to auscultation  Pulmonary exam normal       Cardiovascular Exercise Tolerance: Good hypertension, negative cardio ROS  Rhythm:regular Rate:Normal     Neuro/Psych negative neurological ROS  negative psych ROS   GI/Hepatic negative GI ROS, Neg liver ROS,   Endo/Other  negative endocrine ROS  Renal/GU negative Renal ROS     Musculoskeletal   Abdominal   Peds  Hematology negative hematology ROS (+)   Anesthesia Other Findings   Reproductive/Obstetrics negative OB ROS                          Anesthesia Physical Anesthesia Plan  ASA: II  Anesthesia Plan: General ETT   Post-op Pain Management:    Induction:   Airway Management Planned:   Additional Equipment:   Intra-op Plan:   Post-operative Plan:   Informed Consent: I have reviewed the patients History and Physical, chart, labs and discussed the procedure including the risks, benefits and alternatives for the proposed anesthesia with the patient or authorized representative who has indicated his/her understanding and acceptance.   Dental Advisory Given  Plan Discussed with: CRNA and Surgeon  Anesthesia Plan Comments:         Anesthesia Quick Evaluation

## 2012-06-04 ENCOUNTER — Encounter (HOSPITAL_COMMUNITY): Payer: Self-pay | Admitting: Obstetrics and Gynecology

## 2012-06-04 MED ORDER — DOCUSATE SODIUM 100 MG PO CAPS
100.0000 mg | ORAL_CAPSULE | Freq: Once | ORAL | Status: AC
  Administered 2012-06-04: 100 mg via ORAL
  Filled 2012-06-04: qty 1

## 2012-06-04 MED ORDER — LORATADINE 10 MG PO TABS
10.0000 mg | ORAL_TABLET | Freq: Every day | ORAL | Status: DC
  Administered 2012-06-04: 10 mg via ORAL
  Filled 2012-06-04 (×3): qty 1

## 2012-06-04 MED ORDER — OXYCODONE-ACETAMINOPHEN 5-325 MG PO TABS
2.0000 | ORAL_TABLET | ORAL | Status: DC | PRN
  Administered 2012-06-04 – 2012-06-05 (×6): 1 via ORAL
  Filled 2012-06-04: qty 1
  Filled 2012-06-04: qty 2
  Filled 2012-06-04 (×4): qty 1

## 2012-06-04 NOTE — Op Note (Signed)
Deanna Randolph, SHAWHAN NO.:  [id number]  MEDICAL RECORD NO.:  [id number]  LOCATION:  [facility]                         FACILITY:  [facility]  PHYSICIAN:  Maela Takeda L. Ernestyne Caldwell, M.D.DATE OF BIRTH:  10-07-1961  DATE OF PROCEDURE:  06/03/2012 DATE OF DISCHARGE:                              OPERATIVE REPORT   PREOPERATIVE DIAGNOSIS:  Abnormal uterine bleeding.  POSTOPERATIVE DIAGNOSIS:  Abnormal uterine bleeding and endometriosis, moderate of the pelvis.  SURGEON:  Verenise Moulin L. Vincente Poli, M.D. and Dr. Mitchel Honour.  ANESTHESIA:  General.  EBL:  450-500 mL.  The catheter was then drained through the Foley catheter.  PATHOLOGY:  Uterus, cervix, tubes, and ovaries sent to pathology.  COMPLICATIONS:  None.  PROCEDURE IN DETAIL:  The patient had been consented thoroughly about the risk of the procedure.  She was taken to the operating room, #8. She was intubated.  She was prepped and draped.  A Foley catheter was inserted.  A small Pfannenstiel incision was made at the area of the previous C-section, it was carried down to the fascia.  Fascia scored in the midline, extended laterally.  Rectus muscles were separated in the midline.  The peritoneum was entered bluntly.  The self-retaining retractors were placed in the abdominal cavity.  Exam revealed that she had a slightly enlarged uterus that was retroverted it was slightly adherent to the posterior cul-de-sac.  There was endometriosis behind the uterus.  The ovaries were also stuck down, but we were able to bluntly dissect this free.  There was endometriosis involving the posterior wall of the uterus, the cul-de-sac, and both ovaries.  We started with the hysterectomy with identifying the round ligament on the right side.  Suture ligating that, and then creating an area beneath the IP ligament and placing the clamp just beneath the ovary across the IP ligament.  A careful attention to avoid the ureter, which was deep in the  pelvis.  The pedicle was clamped, cut, and suture ligated using 0 Vicryl suture and then further secured with a suture ligature of 0 Vicryl suture.  We then developed our bladder flap sharply and then identified the uterine artery on the right side.  Clamped the uterine artery at the level of the internal os using a curved Heaney clamp.  The pedicle was then suture ligated using 0 Vicryl suture using a Heaney stitch.  This was done on the left side in a similar fashion.  After we had both mobilized and secured most of the blood flow to the uterus and got down to the internal os.  We then amputated the fundus, and we then placed a series of Kocher clamps across the cervix.  We then developed a bladder flap further and then used straight Heaney clamps to clamp across the uterosacral cardinal ligament complex on either side.  Each pedicle was then clamped, cut, and suture ligated using 0 Vicryl suture. We easily entered the top of the vagina on the left side and then did that on the right side, and removed the specimen, which was cervix, cirrhosis.  Specimen was identified as uterus, tubes, and ovaries, and then the vaginal cuff was closed using 0 Vicryl running locked  stitch. Irrigation was performed.  Hemostasis was very good.  The lap sponges, and self-retaining retractor was removed.  Peritoneum was closed using 0 Vicryl running stitch.  Rectus muscles were reapproximated using 0 Vicryl.  The fascia was closed using 2 stitches using 0 Vicryl running stitch starting each corner meeting in the midline.  After irrigation of subcutaneous layer, the subcu was closed using plain suture in a running stitch.  The skin was closed with subcuticular using 4-0 Vicryl on a Keith needle.  Dermabond was applied.  Local was infiltrated.  All sponge, lap, and instrument counts were correct x2.  Urine output was clear.  She was extubated and went to recovery room in stable condition.     Miley Blanchett L.  Vincente Poli, M.D.     Florestine Avers  D:  06/03/2012  T:  06/04/2012  Job:  [id number]

## 2012-06-04 NOTE — Progress Notes (Signed)
1 Day Post-Op Procedure(s) (LRB): HYSTERECTOMY ABDOMINAL (Bilateral) SALPINGO OOPHERECTOMY (Bilateral)  Subjective: Patient reports tolerating PO.    Objective: I have reviewed patient's vital signs, intake and output, medications and labs.  General: alert, cooperative and appears stated age Vaginal Bleeding: none Abdomen is soft and non tender and incision is clean and dry and intact  Assessment: s/p Procedure(s) (LRB): HYSTERECTOMY ABDOMINAL (Bilateral) SALPINGO OOPHERECTOMY (Bilateral): stable, progressing well and tolerating diet  Plan: Advance diet Encourage ambulation Advance to PO medication  LOS: 1 day    Jihad Brownlow L 06/04/2012, 7:16 AM

## 2012-06-04 NOTE — Progress Notes (Signed)
UR Chart review completed.  

## 2012-06-05 MED ORDER — IBUPROFEN 600 MG PO TABS: 600.0000 mg | ORAL_TABLET | Freq: Four times a day (QID) | ORAL | Status: AC | PRN

## 2012-06-05 MED ORDER — OXYCODONE-ACETAMINOPHEN 5-325 MG PO TABS: 2.0000 | ORAL_TABLET | ORAL | Status: AC | PRN

## 2012-06-05 NOTE — Progress Notes (Signed)
2 Days Post-Op Procedure(s) (LRB): HYSTERECTOMY ABDOMINAL (Bilateral) SALPINGO OOPHERECTOMY (Bilateral)  Subjective: Patient reports tolerating PO, + flatus and no problems voiding.    Objective: I have reviewed patient's vital signs, intake and output, medications and labs.  General: alert, cooperative and appears stated age Vaginal Bleeding: none Abdomen is soft and non tender   Assessment: s/p Procedure(s) (LRB): HYSTERECTOMY ABDOMINAL (Bilateral) SALPINGO OOPHERECTOMY (Bilateral): stable, progressing well and tolerating diet  Plan: Advance diet Discharge home  LOS: 2 days    Roderick Calo L 06/05/2012, 10:04 AM

## 2012-06-05 NOTE — Discharge Summary (Signed)
Admission Diagnosis: Abnormal uterine bleeding  Discharge Diagnosis: Same Endometriosis  Hospital Course: 51 year old Female with abnormal uterine bleeding. On day of admission, she underwent TAH BSO and moderate endometriosis found. She had normal post op course. By POD#2, she was ambulating, tolerating regular diet and voiding.  She was discharged home in good condition.  She was given Percocet and Ibuprofen for pain. She will follow up  In 1 week for incision check.

## 2012-11-26 ENCOUNTER — Other Ambulatory Visit: Payer: Self-pay

## 2012-11-26 MED ORDER — RAMIPRIL 5 MG PO CAPS: 5.0000 mg | ORAL_CAPSULE | Freq: Every day | ORAL | Status: DC

## 2013-01-23 ENCOUNTER — Ambulatory Visit: Payer: Self-pay | Admitting: Internal Medicine

## 2013-01-23 ENCOUNTER — Other Ambulatory Visit: Payer: Self-pay | Admitting: Internal Medicine

## 2013-01-24 ENCOUNTER — Other Ambulatory Visit: Payer: Self-pay | Admitting: Internal Medicine

## 2013-01-27 ENCOUNTER — Ambulatory Visit (INDEPENDENT_AMBULATORY_CARE_PROVIDER_SITE_OTHER): Payer: BC Managed Care – PPO | Admitting: Internal Medicine

## 2013-01-27 ENCOUNTER — Encounter: Payer: Self-pay | Admitting: Internal Medicine

## 2013-01-27 VITALS — BP 124/86 | HR 64 | Temp 98.0°F | Wt 166.0 lb

## 2013-01-27 DIAGNOSIS — I10 Essential (primary) hypertension: Secondary | ICD-10-CM

## 2013-01-27 NOTE — Progress Notes (Signed)
  Subjective:    Patient ID: Deanna Randolph, female    DOB: 04-13-1961, 52 y.o.   MRN: [id number]  HPI 52 year old female native of [place] with history of hypertension treated with ACE inhibitor for recheck. Had some labs drawn through [facility] required by employment. At that time, she had consumed some apple juice and fasting glucose was 148. They did not check kidney or liver functions.  She had complete hysterectomy including BSO August 2013 for menorrhagia. She is now on estrogen patch for hot flashes per GYN. No other complaints or problems.   Family history: Has one brother and one sister. Mother died when patient was 43 years of age of esophageal cancer. Father living with history of hip fracture recently. Father lives in [place]. Social history: This is her second marriage. She is married to an endocrinologist. Has an adult daughter. Patient is currently working in Consulting civil engineer for [employer].  Review of Systems     Objective:   Physical Exam  Vitals reviewed. Constitutional: She is oriented to person, place, and time. She appears well-developed and well-nourished. No distress.  HENT:  Head: Normocephalic and atraumatic.  Right Ear: External ear normal.  Left Ear: External ear normal.  Nose: Nose normal.  Mouth/Throat: Oropharynx is clear and moist. No oropharyngeal exudate.  Eyes: Conjunctivae and EOM are normal. Pupils are equal, round, and reactive to light. Right eye exhibits no discharge. Left eye exhibits no discharge. No scleral icterus.  Neck: Neck supple. No JVD present. No thyromegaly present.  Cardiovascular: Normal rate, regular rhythm and normal heart sounds.   No murmur heard. Pulmonary/Chest: Effort normal and breath sounds normal. She has no wheezes. She has no rales.  Breasts normal female  Abdominal: Bowel sounds are normal. There is no tenderness. There is no rebound and no guarding.  Genitourinary:  Deferred to GYN. Status post total abdominal hysterectomy BSO   Musculoskeletal: She exhibits no edema.  Lymphadenopathy:    She has no cervical adenopathy.  Neurological: She is alert and oriented to person, place, and time. She has normal reflexes. No cranial nerve deficit. Coordination normal.  Skin: Skin is warm and dry. She is not diaphoretic.  Psychiatric: She has a normal mood and affect. Her behavior is normal. Judgment and thought content normal.          Assessment & Plan:  Hypertension  Abnormal glucose the patient was technically nonfasting says she had consumed pineapple juice  Plan: Patient is to have CBC with diff, C-met , hemoglobin A1c, and TSH drawn by phlebotomist

## 2013-01-27 NOTE — Patient Instructions (Addendum)
Continue same medications and return in one year. 

## 2014-02-24 ENCOUNTER — Telehealth: Payer: Self-pay | Admitting: Internal Medicine

## 2014-02-24 ENCOUNTER — Other Ambulatory Visit: Payer: Self-pay

## 2014-02-24 MED ORDER — RAMIPRIL 5 MG PO CAPS: 5.0000 mg | ORAL_CAPSULE | Freq: Every day | ORAL | Status: DC

## 2014-02-24 NOTE — Telephone Encounter (Signed)
Please refill and change pharmacies.

## 2014-03-25 ENCOUNTER — Encounter: Payer: Self-pay | Admitting: Internal Medicine

## 2014-03-25 ENCOUNTER — Ambulatory Visit (INDEPENDENT_AMBULATORY_CARE_PROVIDER_SITE_OTHER): Payer: 59 | Admitting: Internal Medicine

## 2014-03-25 ENCOUNTER — Other Ambulatory Visit: Payer: Self-pay

## 2014-03-25 VITALS — BP 140/82 | HR 76 | Temp 98.9°F | Wt 168.0 lb

## 2014-03-25 DIAGNOSIS — N390 Urinary tract infection, site not specified: Secondary | ICD-10-CM

## 2014-03-26 ENCOUNTER — Other Ambulatory Visit: Payer: Self-pay

## 2014-03-26 ENCOUNTER — Telehealth: Payer: Self-pay | Admitting: Internal Medicine

## 2014-03-26 LAB — URINALYSIS, MICROSCOPIC ONLY
BACTERIA UA: NONE SEEN
CRYSTALS: NONE SEEN
Casts: NONE SEEN
Squamous Epithelial / LPF: NONE SEEN

## 2014-03-26 LAB — URINALYSIS, ROUTINE W REFLEX MICROSCOPIC
Bilirubin Urine: NEGATIVE
Glucose, UA: NEGATIVE mg/dL
Ketones, ur: NEGATIVE mg/dL
Nitrite: NEGATIVE
PROTEIN: NEGATIVE mg/dL
Specific Gravity, Urine: <1.005 — ABNORMAL LOW (ref 1.005–1.030)
UROBILINOGEN UA: 0.2 mg/dL (ref 0.0–1.0)
pH: 6 (ref 5.0–8.0)

## 2014-03-26 MED ORDER — NITROFURANTOIN MONOHYD MACRO 100 MG PO CAPS
100.0000 mg | ORAL_CAPSULE | Freq: Two times a day (BID) | ORAL | Status: DC
Start: 2014-03-26 — End: 2015-01-22

## 2014-03-26 NOTE — Telephone Encounter (Signed)
Patient advised that since we do not have the culture and sensitivities back yet, it is hard to know what medication to prescribe. She has checked with her husband, and is apparently able to tolerate Macrobid. Per verbal order of Dr. Renold Genta, will send Macrobid 100mg  1 po twice daily to [facility]. Advised she needs to return in 2 weeks for a urine recheck.

## 2014-03-28 LAB — URINE CULTURE: Colony Count: 50000

## 2014-03-29 ENCOUNTER — Other Ambulatory Visit: Payer: Self-pay | Admitting: Internal Medicine

## 2014-04-03 ENCOUNTER — Other Ambulatory Visit (INDEPENDENT_AMBULATORY_CARE_PROVIDER_SITE_OTHER): Payer: 59 | Admitting: Internal Medicine

## 2014-04-03 VITALS — Temp 98.3°F

## 2014-04-03 DIAGNOSIS — N390 Urinary tract infection, site not specified: Secondary | ICD-10-CM

## 2014-04-03 LAB — POCT URINALYSIS DIPSTICK
Bilirubin, UA: NEGATIVE
GLUCOSE UA: NEGATIVE
Ketones, UA: NEGATIVE
Leukocytes, UA: NEGATIVE
NITRITE UA: NEGATIVE
Protein, UA: NEGATIVE
RBC UA: NEGATIVE
Spec Grav, UA: 1.015
UROBILINOGEN UA: NEGATIVE
pH, UA: 6.5

## 2014-04-03 NOTE — Progress Notes (Signed)
Patient presents for 2 week recheck of UTI after finishing antibiotics. Asymptomatic. Urine is clear.

## 2014-04-22 ENCOUNTER — Other Ambulatory Visit: Payer: Self-pay | Admitting: Obstetrics and Gynecology

## 2014-04-23 LAB — CYTOLOGY - PAP

## 2014-06-29 NOTE — Progress Notes (Signed)
   Subjective:    Patient ID: Deanna Randolph, female    DOB: 1961/09/26, 53 y.o.   MRN: [id number]  HPI  Recent UTI symptoms. Patient has been busy entertaining family members. She was taking Cipro but is not getting better. No fever or shaking chills.    Review of Systems     Objective:   Physical Exam  No CVA tenderness. Urinalysis is abnormal. Culture was taken.      Assessment & Plan:  UTI  Plan: Keflex 500 mg 4 times daily for 7 days.  Addendum: 03/26/2014 phone call from patient that Keflex is making her nauseated. Husband who is a physician, thinks she will be able to tolerate Macrobid. She's planning to go out-of-town soon.: Macrobid 100 mg twice daily for 7 days. Urinalysis done yesterday is abnormal. Culture is pending.

## 2014-06-29 NOTE — Patient Instructions (Addendum)
Take Keflex 4 times daily as directed for urinary tract infection.

## 2014-08-20 ENCOUNTER — Telehealth: Payer: Self-pay | Admitting: Internal Medicine

## 2014-08-20 MED ORDER — RAMIPRIL 5 MG PO CAPS: ORAL_CAPSULE | ORAL | Status: DC

## 2014-08-20 NOTE — Telephone Encounter (Signed)
Ramipril rx sent to pharmacy.

## 2014-08-20 NOTE — Telephone Encounter (Signed)
Call from patient requesting refill of Ramipril - Please use [facility] phone# [phone]. Patient request call back to know to pick up rx when ready.

## 2014-08-20 NOTE — Telephone Encounter (Signed)
Please refill x one year to [facility] as requested

## 2014-12-25 ENCOUNTER — Telehealth: Payer: Self-pay | Admitting: *Deleted

## 2014-12-25 MED ORDER — CYCLOBENZAPRINE HCL 10 MG PO TABS: 10.0000 mg | ORAL_TABLET | Freq: Three times a day (TID) | ORAL | Status: DC | PRN

## 2014-12-25 NOTE — Telephone Encounter (Signed)
Sent in script for flexeril to patient pharmacy

## 2014-12-25 NOTE — Telephone Encounter (Signed)
Patient called and requested Flexeril be called in for her. Offered patient appt but she stated to just let Dr Renold Genta know she needed flexeril she did not elaborate on her symptoms. Please advise.

## 2014-12-25 NOTE — Telephone Encounter (Signed)
Patient called she wants to know if you can call in Flexeril for her. Please advise.

## 2014-12-25 NOTE — Telephone Encounter (Signed)
Call in Flexeril 10 mg #30 one po tid with one refill

## 2015-01-08 ENCOUNTER — Ambulatory Visit: Payer: 59 | Admitting: Internal Medicine

## 2015-01-20 ENCOUNTER — Telehealth: Payer: Self-pay | Admitting: *Deleted

## 2015-01-20 NOTE — Telephone Encounter (Signed)
Patient states elevated BP for several weeks . Patient scheduled for appt 01/25/2015

## 2015-01-22 ENCOUNTER — Ambulatory Visit (INDEPENDENT_AMBULATORY_CARE_PROVIDER_SITE_OTHER): Payer: 59 | Admitting: Internal Medicine

## 2015-01-22 ENCOUNTER — Encounter: Payer: Self-pay | Admitting: Internal Medicine

## 2015-01-22 VITALS — BP 152/100 | HR 74 | Temp 98.2°F | Wt 164.0 lb

## 2015-01-22 DIAGNOSIS — I10 Essential (primary) hypertension: Secondary | ICD-10-CM

## 2015-01-22 DIAGNOSIS — R5383 Other fatigue: Secondary | ICD-10-CM

## 2015-01-22 LAB — POCT URINALYSIS DIPSTICK
BILIRUBIN UA: NEGATIVE
Blood, UA: NEGATIVE
Glucose, UA: NEGATIVE
KETONES UA: NEGATIVE
LEUKOCYTES UA: NEGATIVE
NITRITE UA: NEGATIVE
Protein, UA: NEGATIVE
Spec Grav, UA: <=1.005
Urobilinogen, UA: NEGATIVE
pH, UA: 7.5

## 2015-01-22 LAB — CBC WITH DIFFERENTIAL/PLATELET
BASOS PCT: 1 % (ref 0–1)
Basophils Absolute: 0.1 10*3/uL (ref 0.0–0.1)
EOS ABS: 0.1 10*3/uL (ref 0.0–0.7)
EOS PCT: 1 % (ref 0–5)
HEMATOCRIT: 42.1 % (ref 36.0–46.0)
HEMOGLOBIN: 13.5 g/dL (ref 12.0–15.0)
LYMPHS ABS: 2.7 10*3/uL (ref 0.7–4.0)
Lymphocytes Relative: 35 % (ref 12–46)
MCH: 27.9 pg (ref 26.0–34.0)
MCHC: 32.1 g/dL (ref 30.0–36.0)
MCV: 87 fL (ref 78.0–100.0)
MONOS PCT: 7 % (ref 3–12)
MPV: 10.4 fL (ref 8.6–12.4)
Monocytes Absolute: 0.5 10*3/uL (ref 0.1–1.0)
Neutro Abs: 4.3 10*3/uL (ref 1.7–7.7)
Neutrophils Relative %: 56 % (ref 43–77)
Platelets: 301 10*3/uL (ref 150–400)
RBC: 4.84 MIL/uL (ref 3.87–5.11)
RDW: 14.2 % (ref 11.5–15.5)
WBC: 7.6 10*3/uL (ref 4.0–10.5)

## 2015-01-22 NOTE — Patient Instructions (Addendum)
Take Flexeril at bedtime. Take Advil for headache.Take ramipril 5 mg twice a day. Call with BP readings. Monitor blood pressure at home. Return in 6 months for follow-up.

## 2015-01-23 LAB — COMPLETE METABOLIC PANEL WITH GFR
ALT: 19 U/L (ref 0–35)
AST: 20 U/L (ref 0–37)
Albumin: 4.6 g/dL (ref 3.5–5.2)
Alkaline Phosphatase: 54 U/L (ref 39–117)
BUN: 10 mg/dL (ref 6–23)
CALCIUM: 10.2 mg/dL (ref 8.4–10.5)
CO2: 29 mEq/L (ref 19–32)
Chloride: 104 mEq/L (ref 96–112)
Creat: 0.64 mg/dL (ref 0.50–1.10)
GFR, Est African American: >89 mL/min
GFR, Est Non African American: >89 mL/min
GLUCOSE: 89 mg/dL (ref 70–99)
POTASSIUM: 4.7 meq/L (ref 3.5–5.3)
Sodium: 144 mEq/L (ref 135–145)
TOTAL PROTEIN: 7.6 g/dL (ref 6.0–8.3)
Total Bilirubin: 1.1 mg/dL (ref 0.2–1.2)

## 2015-01-23 LAB — TSH: TSH: 2.515 u[IU]/mL (ref 0.350–4.500)

## 2015-01-23 LAB — T4, FREE: Free T4: 1.25 ng/dL (ref 0.80–1.80)

## 2015-01-25 ENCOUNTER — Encounter: Payer: Self-pay | Admitting: Internal Medicine

## 2015-01-25 ENCOUNTER — Ambulatory Visit: Payer: Self-pay | Admitting: Internal Medicine

## 2015-02-02 ENCOUNTER — Encounter: Payer: Self-pay | Admitting: Internal Medicine

## 2015-02-02 MED ORDER — RAMIPRIL 5 MG PO CAPS: 5.0000 mg | ORAL_CAPSULE | Freq: Two times a day (BID) | ORAL | Status: DC

## 2015-02-02 NOTE — Telephone Encounter (Signed)
Refilling Ramapril 5 mg twice daily. Patient reports blood pressure has improved with twice a day dosing.

## 2015-03-21 NOTE — Progress Notes (Signed)
   Subjective:    Patient ID: Deanna Randolph, female    DOB: [birth year], 54 y.o.   MRN: [id number]  HPI 54 year old Female in today with elevated blood pressure. Blood pressures have been running high recently a low-dose Ramapril. She is accompanied by her husband today, Dr. Elayne Snare. She has a headache. 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 Generally has not felt well. Is fatigued.   Review of Systems headache for a couple of days with no visual disturbances. No vomiting. No dizziness. Fatigued.     Objective:   Physical Exam  Skin warm and dry. Nodes none. Funduscopic exam is benign with a discharge and flat bilaterally. PERRLA. TMs and pharynx are clear. Neck is supple without JVD thyromegaly or carotid bruits. Chest clear to auscultation without rales or wheezing. Cardiac exam: Regular rate and rhythm without murmurs or gallops. Extremities without edema. Brief neurological exam no focal deficits. No nystagmus. She has paracervical muscle tenderness particularly on the left side extending to occipital area.      Assessment & Plan:   Elevated blood pressure  Headache- Have prescribed Flexeril at bedtime. May take over-the-counter NSAID for pain relief. Probable muscle contraction headache  Plan: Increase Ramapril 5 mg twice daily and call with blood pressure results in a few days. Call if headache not relieved by tomorrow.  Free T4 and TSH drawn. CBC and see met drawn. Urinalysis normal.  25 minutes spent with patient and her husband.

## 2015-04-12 ENCOUNTER — Encounter: Payer: Self-pay | Admitting: Internal Medicine

## 2015-11-02 MED FILL — MINIVELLE 0.05 MG PATCH: 0.05 | 28 days supply | Qty: 8 | Fill #0

## 2015-11-29 MED FILL — MINIVELLE 0.05 MG PATCH: 0.05 | 28 days supply | Qty: 8 | Fill #1

## 2015-12-27 ENCOUNTER — Other Ambulatory Visit: Payer: Self-pay | Admitting: Internal Medicine

## 2016-01-03 MED FILL — MINIVELLE 0.05 MG PATCH: 0.05 | 28 days supply | Qty: 8 | Fill #2

## 2016-01-05 MED FILL — RAMIPRIL 5 MG CAPSULE: 5 | 90 days supply | Qty: 180 | Fill #0

## 2016-01-31 MED FILL — MINIVELLE 0.05 MG PATCH: 0.05 | 28 days supply | Qty: 8 | Fill #3

## 2016-02-15 DIAGNOSIS — H6122 Impacted cerumen, left ear: Secondary | ICD-10-CM | POA: Diagnosis not present

## 2016-02-15 DIAGNOSIS — H6062 Unspecified chronic otitis externa, left ear: Secondary | ICD-10-CM | POA: Diagnosis not present

## 2016-02-15 MED FILL — NEO/POLYMYXIN/HC EAR SUSP: 3.5-10000-1 | 30 days supply | Qty: 10 | Fill #0

## 2016-02-28 MED FILL — MINIVELLE 0.05 MG PATCH: 0.05 | 28 days supply | Qty: 8 | Fill #4

## 2016-03-28 MED FILL — MINIVELLE 0.05 MG PATCH: 0.05 | 28 days supply | Qty: 8 | Fill #5

## 2016-04-21 MED FILL — MINIVELLE 0.05 MG PATCH: 0.05 | 28 days supply | Qty: 8 | Fill #0

## 2016-04-26 ENCOUNTER — Telehealth: Payer: Self-pay | Admitting: Internal Medicine

## 2016-04-26 MED FILL — CYCLOBENZAPRINE 10 MG TAB: 10 | 10 days supply | Qty: 30 | Fill #0

## 2016-04-26 NOTE — Telephone Encounter (Signed)
Dr Dwyane Dee, patient's husband, calling stating patient is having headaches and increased BP x 2 weeks.  States patient is on Ramipril 5mg  once/day.  Advised patient diastolic pressure has been running between 86-90 and is normally under 80.  Wants to know if Dr. Renold Genta wants to put patient on something in addition to Ramipril.    Spoke with Dr. Renold Genta and she advised to speak with patient and see how she's feeling.  Spoke with patient; she advised that she has been having headaches, tension in the back of her neck.  States this morning, pressure was 133/89 after her shower.  States that she has been taking Ramipril 5mg  bid and this helps the headache to be relieved and the tension in her neck as well.  Patient also states that she has narrowed down that dark chocolate seems to be causing her head to ache as well.  Advised she may want to stay away from chocolate as that can cause migraines.    Spoke with Dr. Renold Genta again; provided update from patient.  She advised for patient to take Ramipril 10mg  in the morning and Flexeril 10mg  tid prn for the tension/migraine headaches.  Spoke with Chasta @ [facility] Flexeril 10mg  #30, no refill.  Take 1 tablet by mouth three times a day prn muscle spasms.  Patient given appointment for Monday, 7/10 @ 2:45.    Dr. Dwyane Dee called back @ 4:50 p.m. And wanted to know if I had spoken with Dr. Renold Genta.  Advised that I had just hung up from speaking with Dr. Renold Genta and provided him with information.  Dr. Dwyane Dee wanted something added to her BP regimen.  Advised that BP wasn't really that elevated at this point and Dr. Renold Genta wanted to see the patient on Monday, 7/10 @ 2:45.  Until then, he would like for the patient to take 10mg  Ramipril in the morning and Flexiril one, tid as needed for tension/headaches.  And, we will re-evaluate her BP regimen at the visit on Monday, 7/10.  He advised they are going out of town on Friday, 7/7 and he's concerned about her BP and  that it has been elevated and that she continues to get headaches.  Reassured Dr. Dwyane Dee that Dr. Renold Genta didn't want to add something prematurely, so she wanted to just double the medication in the a.m. For now and add the Flexeril and we will see patient on Monday.   133/89 is not really too elevated at this point.    Left message on voicemail for patient and advised of all instructions as given per Dr. Renold Genta.  Patient instructed to take 10mg  Ramipril in the morning for BP and Flexeril 10mg  TID as needed for muscle spasms/neck tension.  Patient is instructed to come to the office on Monday, 7/10 @ 2:45 for appointment for BP check.

## 2016-05-01 ENCOUNTER — Ambulatory Visit (INDEPENDENT_AMBULATORY_CARE_PROVIDER_SITE_OTHER): Payer: 59 | Admitting: Internal Medicine

## 2016-05-01 ENCOUNTER — Encounter: Payer: Self-pay | Admitting: Internal Medicine

## 2016-05-01 VITALS — BP 136/92 | HR 70 | Temp 98.3°F | Resp 18 | Ht 65.0 in | Wt 172.0 lb

## 2016-05-01 DIAGNOSIS — I10 Essential (primary) hypertension: Secondary | ICD-10-CM

## 2016-05-01 DIAGNOSIS — R030 Elevated blood-pressure reading, without diagnosis of hypertension: Secondary | ICD-10-CM | POA: Diagnosis not present

## 2016-05-01 DIAGNOSIS — Z8669 Personal history of other diseases of the nervous system and sense organs: Secondary | ICD-10-CM | POA: Diagnosis not present

## 2016-05-01 DIAGNOSIS — IMO0001 Reserved for inherently not codable concepts without codable children: Secondary | ICD-10-CM

## 2016-05-01 MED ORDER — LOSARTAN POTASSIUM 50 MG PO TABS: 50.0000 mg | ORAL_TABLET | Freq: Every day | ORAL | Status: DC

## 2016-05-01 MED ORDER — ELETRIPTAN HYDROBROMIDE 40 MG PO TABS: 40.0000 mg | ORAL_TABLET | ORAL | Status: DC | PRN

## 2016-05-01 MED FILL — LOSARTAN POTASSIUM 50 MG TA: 50 | 30 days supply | Qty: 30 | Fill #0

## 2016-05-01 NOTE — Progress Notes (Signed)
   Subjective:    Patient ID: Deanna Randolph, female    DOB: 08/08/1961, 55 y.o.   MRN: [id number]  HPI 55 year old Female who recently developed headaches and increased BP. Hx HTN treated with Ramipril.Patient feels Ramipril  is no longer working for her. I do think she has migraine headaches. Seems to be triggered by alcohol and sleep deprivation. We reviewed migraine triggers today. She's concerned about her blood pressure because it previously was well controlled and only recently seems elevated. It is possible that the pain with headaches is causing the blood pressure to elevate.    Review of Systems no vomiting with headache.     Objective:   Physical Exam Funduscopic exam is benign. Skin warm and dry. Nodes none. Neck is supple without JVD thyromegaly or carotid bruits. Chest clear to auscultation. Cardiac exam regular rate and rhythm. Extremities without edema. No focal deficits on brief neurological exam.       Assessment & Plan:  Elevated blood pressure  History of hypertension  Migraine headaches  Plan: Start Cozaar 50 mg daily and discontinue Ramapril and call with progress report with blood pressure readings in a few days. Trip 10 medication for migraine headaches.

## 2016-05-03 ENCOUNTER — Telehealth: Payer: Self-pay

## 2016-05-03 MED ORDER — LOSARTAN POTASSIUM 100 MG PO TABS: 50.0000 mg | ORAL_TABLET | Freq: Every day | ORAL | Status: DC

## 2016-05-03 MED ORDER — SUMATRIPTAN SUCCINATE 100 MG PO TABS: 100.0000 mg | ORAL_TABLET | ORAL | Status: DC | PRN

## 2016-05-03 MED FILL — SUMATRIPTAN SUCC 100 MG TAB: 100 | 30 days supply | Qty: 9 | Fill #0

## 2016-05-03 NOTE — Telephone Encounter (Signed)
Patient states that she has been taking losartan once daily. She states that she took 2 losartan 50mg  this am. She states her blood pressure 145/100. She complains She wants maxalt or imitrex instead of relpax.

## 2016-05-03 NOTE — Telephone Encounter (Signed)
It may take a few days for BP to come down. Should not expect immediate results. May Take 100 mg daily Losartan for now. Change Relpax to Imitrex 100 mg tablets

## 2016-05-03 NOTE — Telephone Encounter (Signed)
Patient notified. Prescription for losartan 100mg  and imitrex 100 sent to pharmacy

## 2016-05-17 ENCOUNTER — Encounter: Payer: Self-pay | Admitting: Internal Medicine

## 2016-05-18 ENCOUNTER — Telehealth: Payer: Self-pay | Admitting: Internal Medicine

## 2016-05-18 MED ORDER — LOSARTAN POTASSIUM 50 MG PO TABS: 50.0000 mg | ORAL_TABLET | Freq: Every day | ORAL | 0 refills | Status: DC

## 2016-05-18 NOTE — Telephone Encounter (Signed)
Benjamine Mola from [facility] called to clarify Losartan dosage this morning.  Patient had 2 different strengths on file.  She is going to suspend the 100mg  dosage so there is no more confusion.    They will fill the 50mg  Losartan #90 and suspend the 100mg  because it was NEVER filled.

## 2016-05-18 NOTE — Telephone Encounter (Signed)
Refill Losartan 50 mg daily at her request. Have told her she needs B-met in next few weeks

## 2016-05-19 ENCOUNTER — Ambulatory Visit: Payer: 59 | Admitting: Internal Medicine

## 2016-05-21 NOTE — Patient Instructions (Signed)
Discontinue ramipril. Start Cozaar 50 mg daily. Call with progress report in blood pressure readings in a few days. Triptan medication for migraine headache.

## 2016-05-23 ENCOUNTER — Other Ambulatory Visit: Payer: Self-pay | Admitting: Internal Medicine

## 2016-05-23 MED ORDER — LOSARTAN POTASSIUM 50 MG PO TABS: 50.0000 mg | ORAL_TABLET | Freq: Every day | ORAL | 0 refills | Status: DC

## 2016-05-23 MED FILL — MINIVELLE 0.05 MG PATCH: 0.05 | 28 days supply | Qty: 8 | Fill #1

## 2016-05-23 MED FILL — LOSARTAN POTASSIUM 50 MG TA: 50 | 90 days supply | Qty: 90 | Fill #0

## 2016-06-23 MED FILL — MINIVELLE 0.05 MG PATCH: 0.05 | 28 days supply | Qty: 8 | Fill #2

## 2016-07-21 MED FILL — MINIVELLE 0.05 MG PATCH: 0.05 | 28 days supply | Qty: 8 | Fill #3

## 2016-08-18 MED FILL — MINIVELLE 0.05 MG PATCH: 0.05 | 28 days supply | Qty: 8 | Fill #4

## 2016-08-18 MED FILL — LOSARTAN POTASSIUM 50 MG TA: 50 | 90 days supply | Qty: 90 | Fill #0

## 2016-09-04 ENCOUNTER — Encounter: Payer: Self-pay | Admitting: Internal Medicine

## 2016-09-04 ENCOUNTER — Ambulatory Visit (INDEPENDENT_AMBULATORY_CARE_PROVIDER_SITE_OTHER): Payer: 59 | Admitting: Internal Medicine

## 2016-09-04 VITALS — BP 158/88 | Temp 98.0°F

## 2016-09-04 DIAGNOSIS — R350 Frequency of micturition: Secondary | ICD-10-CM

## 2016-09-04 DIAGNOSIS — R300 Dysuria: Secondary | ICD-10-CM | POA: Diagnosis not present

## 2016-09-04 DIAGNOSIS — I10 Essential (primary) hypertension: Secondary | ICD-10-CM

## 2016-09-04 DIAGNOSIS — N390 Urinary tract infection, site not specified: Secondary | ICD-10-CM | POA: Diagnosis not present

## 2016-09-04 LAB — POCT URINALYSIS DIPSTICK
Bilirubin, UA: NEGATIVE
GLUCOSE UA: NEGATIVE
Ketones, UA: NEGATIVE
Nitrite, UA: NEGATIVE
Protein, UA: NEGATIVE
SPEC GRAV UA: 1.01
UROBILINOGEN UA: NEGATIVE
pH, UA: 6.5

## 2016-09-04 MED ORDER — NITROFURANTOIN MONOHYD MACRO 100 MG PO CAPS: 100.0000 mg | ORAL_CAPSULE | Freq: Two times a day (BID) | ORAL | 0 refills | Status: DC

## 2016-09-04 MED FILL — NITROFURANTOIN MONO-MCR 100: 100 | 10 days supply | Qty: 20 | Fill #0

## 2016-09-05 ENCOUNTER — Telehealth: Payer: Self-pay | Admitting: Internal Medicine

## 2016-09-05 MED ORDER — FLUCONAZOLE 150 MG PO TABS: 150.0000 mg | ORAL_TABLET | Freq: Once | ORAL | 2 refills | Status: AC

## 2016-09-05 NOTE — Telephone Encounter (Signed)
Diflucan sent to pharmacy, patient aware.

## 2016-09-05 NOTE — Telephone Encounter (Signed)
Please call in Diflucan 150 mg tablet with 2 refills

## 2016-09-05 NOTE — Telephone Encounter (Signed)
Patient calling this morning to ask for something for a yeast infection.  States that the antibiotic normally gives her a yeast infection.  Can we send this to [facility] please?    Thank you.

## 2016-09-08 LAB — URINE CULTURE

## 2016-09-16 ENCOUNTER — Encounter: Payer: Self-pay | Admitting: Internal Medicine

## 2016-09-16 NOTE — Progress Notes (Signed)
   Subjective:    Patient ID: Deanna Randolph, female    DOB: 29-Jul-1961, 55 y.o.   MRN: [id number]  HPI 55 year old female called complaining of acute onset of dysuria and UTI symptoms. She had a similar episode in 2015. Urine culture at that time grew 50,000 colonies per milliliter Escherichia coli. It was resistant to Cipro that she had on hand. When I saw her, I initially treated her  with Keflex but that made her nauseated. She was subsequently treated with Macrobid and symptoms resolved.  She has a history of hypertension    Review of Systems as above     Objective:   Physical Exam Was seen by CMA and not physician. She is afebrile. Blood pressure elevated 158/88 but she rushed into the office and was not feeling well. Dipstick urine shows moderate LE. Culture was sent.       Assessment & Plan:  Acute UTI  Plan: Macrobid 100 mg twice daily for 10 days.  Addendum: Urine culture grew 50-100,000 colonies per milliliter Escherichia coli sensitive to Macrobid.

## 2016-09-16 NOTE — Patient Instructions (Addendum)
Macrobid 100 mg twice daily for 10 days. Urine culture sent.  Addendum: Organism is sensitive to Macrobid. Finish 10 day course.

## 2016-09-18 MED FILL — MINIVELLE 0.05 MG PATCH: 0.05 | 28 days supply | Qty: 8 | Fill #5

## 2016-10-02 DIAGNOSIS — Z6828 Body mass index (BMI) 28.0-28.9, adult: Secondary | ICD-10-CM | POA: Diagnosis not present

## 2016-10-02 DIAGNOSIS — Z01419 Encounter for gynecological examination (general) (routine) without abnormal findings: Secondary | ICD-10-CM | POA: Diagnosis not present

## 2016-10-02 DIAGNOSIS — Z1231 Encounter for screening mammogram for malignant neoplasm of breast: Secondary | ICD-10-CM | POA: Diagnosis not present

## 2016-10-13 MED FILL — MINIVELLE 0.05 MG PATCH: 0.05 | 28 days supply | Qty: 8 | Fill #0

## 2016-11-06 MED FILL — MINIVELLE 0.05 MG PATCH: 0.05 | 28 days supply | Qty: 8 | Fill #1

## 2016-11-11 ENCOUNTER — Other Ambulatory Visit: Payer: Self-pay | Admitting: Internal Medicine

## 2016-11-11 ENCOUNTER — Encounter: Payer: Self-pay | Admitting: Internal Medicine

## 2016-11-12 MED ORDER — LOSARTAN POTASSIUM 50 MG PO TABS: 50.0000 mg | ORAL_TABLET | Freq: Every day | ORAL | 0 refills | Status: DC

## 2016-11-12 NOTE — Telephone Encounter (Signed)
Pt wants refill to [facility]

## 2016-12-11 ENCOUNTER — Encounter: Payer: Self-pay | Admitting: Internal Medicine

## 2016-12-12 ENCOUNTER — Other Ambulatory Visit: Payer: Self-pay

## 2016-12-12 MED ORDER — LIDOCAINE HCL 3 % EX CREA: 1.0000 "application " | TOPICAL_CREAM | CUTANEOUS | 11 refills | Status: DC | PRN

## 2016-12-12 MED FILL — LIDOCAINE-HC 3-0.5% CREAM: 3-0.5 | 10 days supply | Qty: 28 | Fill #0

## 2016-12-12 MED FILL — MINIVELLE 0.05 MG PATCH: 0.05 | 28 days supply | Qty: 8 | Fill #2

## 2017-01-09 MED FILL — MINIVELLE 0.05 MG PATCH: 0.05 | 28 days supply | Qty: 8 | Fill #3

## 2017-02-02 ENCOUNTER — Other Ambulatory Visit: Payer: Self-pay | Admitting: Internal Medicine

## 2017-02-08 MED FILL — MINIVELLE 0.05 MG PATCH: 0.05 | 28 days supply | Qty: 8 | Fill #4

## 2017-03-12 MED FILL — MINIVELLE 0.05 MG PATCH: 0.05 | 28 days supply | Qty: 8 | Fill #5

## 2017-04-03 MED FILL — MINIVELLE 0.05 MG PATCH: 0.05 | 28 days supply | Qty: 8 | Fill #6

## 2017-04-23 ENCOUNTER — Other Ambulatory Visit: Payer: Self-pay | Admitting: Internal Medicine

## 2017-04-30 ENCOUNTER — Telehealth: Payer: Self-pay

## 2017-04-30 DIAGNOSIS — Z1239 Encounter for other screening for malignant neoplasm of breast: Secondary | ICD-10-CM

## 2017-04-30 NOTE — Telephone Encounter (Signed)
Order for mammogram placed and letter sent

## 2017-05-01 ENCOUNTER — Encounter: Payer: Self-pay | Admitting: Internal Medicine

## 2017-05-07 MED FILL — MINIVELLE 0.05 MG PATCH: 0.05 | 28 days supply | Qty: 8 | Fill #7

## 2017-06-07 MED FILL — MINIVELLE 0.05 MG PATCH: 0.05 | 28 days supply | Qty: 8 | Fill #8

## 2017-07-03 MED FILL — MINIVELLE 0.05 MG PATCH: 0.05 | 28 days supply | Qty: 8 | Fill #9

## 2017-07-04 ENCOUNTER — Other Ambulatory Visit: Payer: Self-pay | Admitting: Internal Medicine

## 2017-08-10 ENCOUNTER — Ambulatory Visit (INDEPENDENT_AMBULATORY_CARE_PROVIDER_SITE_OTHER): Payer: BLUE CROSS/BLUE SHIELD

## 2017-08-10 ENCOUNTER — Ambulatory Visit (INDEPENDENT_AMBULATORY_CARE_PROVIDER_SITE_OTHER): Payer: BLUE CROSS/BLUE SHIELD | Admitting: Rheumatology

## 2017-08-10 ENCOUNTER — Encounter: Payer: Self-pay | Admitting: Rheumatology

## 2017-08-10 VITALS — BP 130/70 | HR 66 | Resp 12 | Ht 66.0 in | Wt 171.0 lb

## 2017-08-10 DIAGNOSIS — M7062 Trochanteric bursitis, left hip: Secondary | ICD-10-CM | POA: Diagnosis not present

## 2017-08-10 DIAGNOSIS — M25562 Pain in left knee: Secondary | ICD-10-CM

## 2017-08-10 DIAGNOSIS — M7632 Iliotibial band syndrome, left leg: Secondary | ICD-10-CM

## 2017-08-10 DIAGNOSIS — I10 Essential (primary) hypertension: Secondary | ICD-10-CM | POA: Diagnosis not present

## 2017-08-10 MED ORDER — DICLOFENAC SODIUM 1 % TD GEL: TRANSDERMAL | 0 refills | Status: DC

## 2017-08-10 MED ORDER — DICLOFENAC EPOLAMINE 1.3 % TD PTCH: 1.0000 | MEDICATED_PATCH | Freq: Two times a day (BID) | TRANSDERMAL | 0 refills | Status: DC

## 2017-08-10 NOTE — Progress Notes (Signed)
Office Visit Note  Patient: Deanna Randolph             Date of Birth: 07/11/1961           MRN: [id number]             PCP: Elby Showers, MD Referring: Elby Showers, MD Visit Date: 08/10/2017 Occupation: @GUAROCC @    Subjective:  Left knee pain.   History of Present Illness: Deanna Randolph is a 56 y.o. female seen on urgent basis for evaluation of left knee pain. According to patient she's been having discomfort in her left knee for the last 1 week. Which she describes over the left lateral aspect of her left knee. She has had problems with left trochanteric bursitis in the past which is resolved. She denies any joint swelling. None of the other joints are painful.  Activities of Daily Living:  Patient reports morning stiffness for 2 minute.   Patient Reports nocturnal pain.  Difficulty dressing/grooming: Denies Difficulty climbing stairs: Denies Difficulty getting out of chair: Denies Difficulty using hands for taps, buttons, cutlery, and/or writing: Denies   Review of Systems  Constitutional: Negative for fatigue, night sweats, weight gain, weight loss and weakness.  HENT: Negative for mouth sores, trouble swallowing, trouble swallowing, mouth dryness and nose dryness.   Eyes: Negative for pain, redness, visual disturbance and dryness.  Respiratory: Negative for cough, shortness of breath and difficulty breathing.   Cardiovascular: Negative for chest pain, palpitations, hypertension, irregular heartbeat and swelling in legs/feet.  Gastrointestinal: Negative for blood in stool, constipation and diarrhea.  Endocrine: Negative for increased urination.  Genitourinary: Negative for vaginal dryness.  Musculoskeletal: Positive for arthralgias, joint pain and morning stiffness. Negative for joint swelling, myalgias, muscle weakness, muscle tenderness and myalgias.  Skin: Negative for color change, rash, hair loss, skin tightness, ulcers and sensitivity to sunlight.    Allergic/Immunologic: Negative for susceptible to infections.  Neurological: Negative for dizziness, memory loss and night sweats.  Hematological: Negative for swollen glands.  Psychiatric/Behavioral: Negative for depressed mood and sleep disturbance. The patient is not nervous/anxious.     PMFS History:  Patient Active Problem List   Diagnosis Date Noted  . Hypertension 01/01/2012    Past Medical History:  Diagnosis Date  . Hypertension     Family History  Problem Relation Age of Onset  . Cancer Mother    Past Surgical History:  Procedure Laterality Date  . ABDOMINAL HYSTERECTOMY  06/03/2012   Procedure: HYSTERECTOMY ABDOMINAL;  Surgeon: Cyril Mourning, MD;  Location: [facility];  Service: Gynecology;  Laterality: Bilateral;  . DILATION AND CURETTAGE OF UTERUS    . DILATION AND CURETTAGE OF UTERUS  04/17/2012   Procedure: DILATATION AND CURETTAGE;  Surgeon: Cyril Mourning, MD;  Location: [facility];  Service: Gynecology;  Laterality: N/A;  . SALPINGOOPHORECTOMY  06/03/2012   Procedure: SALPINGO OOPHERECTOMY;  Surgeon: Cyril Mourning, MD;  Location: [facility];  Service: Gynecology;  Laterality: Bilateral;   Social History   Social History Narrative  . No narrative on file     Objective: Vital Signs: BP 130/70   Pulse 66   Resp 12   Ht 5\' 6"  (1.676 m)   Wt 171 lb (77.6 kg)   BMI 27.60 kg/m    Physical Exam  Constitutional: She is oriented to person, place, and time. She appears well-developed and well-nourished.  HENT:  Head: Normocephalic and atraumatic.  Eyes: Conjunctivae and EOM are normal.  Neck: Normal range of  motion.  Cardiovascular: Normal rate, regular rhythm, normal heart sounds and intact distal pulses.   Pulmonary/Chest: Effort normal and breath sounds normal.  Abdominal: Soft. Bowel sounds are normal.  Lymphadenopathy:    She has no cervical adenopathy.  Neurological: She is alert and oriented to person, place, and time.  Skin: Skin is warm and dry.  Capillary refill takes less than 2 seconds.  Psychiatric: She has a normal mood and affect. Her behavior is normal.  Nursing note and vitals reviewed.    Musculoskeletal Exam: C-spine and thoracic lumbar spine good range of motion. Shoulder joints elbow joints wrist joint MCPs PIPs DIPs with good range of motion. Hip joints knee joints ankles MTPs PIPs DIPs with good range of motion. She is some tenderness on lateral aspect of her left knee at the insertion of the IT band. She also had tenderness over bilateral trochanteric bursa area left more  than the right.  CDAI Exam: No CDAI exam completed.    Investigation: No additional findings.   Imaging: Xr Knee 3 View Left  Result Date: 08/10/2017 Mild to moderate medial compartment narrowing with medial osteophyte and intercondylar osteophytes was noted. No chondrocalcinosis was noted. Moderate patellofemoral narrowing was noted. 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    Procedures:  No procedures performed Allergies: Patient has no known allergies.   Assessment / Plan:     Visit Diagnoses: Acute pain of left knee - Plan: XR KNEE 3 VIEW LEFT. 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  Trochanteric bursitis of left hip: She also has tenderness over trochanteric bursa. I given exercises handout was given. Some of the exercises were demonstrated in office today.  Essential hypertension : Her systolic blood pressure is mildly elevated today.   Orders: Orders Placed This Encounter  Procedures  . XR KNEE 3 VIEW LEFT   Meds ordered this encounter  Medications  . diclofenac (FLECTOR) 1.3 % PTCH    Sig: Place 1 patch onto the skin 2 (two) times  daily.    Dispense:  2 patch    Refill:  0  . diclofenac sodium (VOLTAREN) 1 % GEL    Sig: Apply 3 gm to 3 large joints up to 3 times a day.Dispense 3 tubes with 3 refills.    Dispense:  3 Tube    Refill:  0    Face-to-face time spent with patient was 30 minutes. Greater than 50% of time was spent in counseling and coordination of care.  Follow-Up Instructions: Return if symptoms worsen or fail to improve, for Knee pain.   Bo Merino, MD  Note - This record has been created using Editor, commissioning.  Chart creation errors have been sought, but may not always  have been located. Such creation errors do not reflect on  the standard of medical care.

## 2017-08-10 NOTE — Patient Instructions (Signed)
Knee Exercises Ask your health care provider which exercises are safe for you. Do exercises exactly as told by your health care provider and adjust them as directed. It is normal to feel mild stretching, pulling, tightness, or discomfort as you do these exercises, but you should stop right away if you feel sudden pain or your pain gets worse.Do not begin these exercises until told by your health care provider. STRETCHING AND RANGE OF MOTION EXERCISES These exercises warm up your muscles and joints and improve the movement and flexibility of your knee. These exercises also help to relieve pain, numbness, and tingling. Exercise A: Knee Extension, Prone 1. Lie on your abdomen on a bed. 2. Place your left / right knee just beyond the edge of the surface so your knee is not on the bed. You can put a towel under your left / right thigh just above your knee for comfort. 3. Relax your leg muscles and allow gravity to straighten your knee. You should feel a stretch behind your left / right knee. 4. Hold this position for __________ seconds. 5. Scoot up so your knee is supported between repetitions. Repeat __________ times. Complete this stretch __________ times a day. Exercise B: Knee Flexion, Active  1. Lie on your back with both knees straight. If this causes back discomfort, bend your left / right knee so your foot is flat on the floor. 2. Slowly slide your left / right heel back toward your buttocks until you feel a gentle stretch in the front of your knee or thigh. 3. Hold this position for __________ seconds. 4. Slowly slide your left / right heel back to the starting position. Repeat __________ times. Complete this exercise __________ times a day. Exercise C: Quadriceps, Prone  1. Lie on your abdomen on a firm surface, such as a bed or padded floor. 2. Bend your left / right knee and hold your ankle. If you cannot reach your ankle or pant leg, loop a belt around your foot and grab the belt  instead. 3. Gently pull your heel toward your buttocks. Your knee should not slide out to the side. You should feel a stretch in the front of your thigh and knee. 4. Hold this position for __________ seconds. Repeat __________ times. Complete this stretch __________ times a day. Exercise D: Hamstring, Supine 1. Lie on your back. 2. Loop a belt or towel over the ball of your left / right foot. The ball of your foot is on the walking surface, right under your toes. 3. Straighten your left / right knee and slowly pull on the belt to raise your leg until you feel a gentle stretch behind your knee. ? Do not let your left / right knee bend while you do this. ? Keep your other leg flat on the floor. 4. Hold this position for __________ seconds. Repeat __________ times. Complete this stretch __________ times a day. STRENGTHENING EXERCISES These exercises build strength and endurance in your knee. Endurance is the ability to use your muscles for a long time, even after they get tired. Exercise E: Quadriceps, Isometric  1. Lie on your back with your left / right leg extended and your other knee bent. Put a rolled towel or small pillow under your knee if told by your health care provider. 2. Slowly tense the muscles in the front of your left / right thigh. You should see your kneecap slide up toward your hip or see increased dimpling just above the knee. This   motion will push the back of the knee toward the floor. 3. For __________ seconds, keep the muscle as tight as you can without increasing your pain. 4. Relax the muscles slowly and completely. Repeat __________ times. Complete this exercise __________ times a day. Exercise F: Straight Leg Raises - Quadriceps 1. Lie on your back with your left / right leg extended and your other knee bent. 2. Tense the muscles in the front of your left / right thigh. You should see your kneecap slide up or see increased dimpling just above the knee. Your thigh may  even shake a bit. 3. Keep these muscles tight as you raise your leg 4-6 inches (10-15 cm) off the floor. Do not let your knee bend. 4. Hold this position for __________ seconds. 5. Keep these muscles tense as you lower your leg. 6. Relax your muscles slowly and completely after each repetition. Repeat __________ times. Complete this exercise __________ times a day. Exercise G: Hamstring, Isometric 1. Lie on your back on a firm surface. 2. Bend your left / right knee approximately __________ degrees. 3. Dig your left / right heel into the surface as if you are trying to pull it toward your buttocks. Tighten the muscles in the back of your thighs to dig as hard as you can without increasing any pain. 4. Hold this position for __________ seconds. 5. Release the tension gradually and allow your muscles to relax completely for __________ seconds after each repetition. Repeat __________ times. Complete this exercise __________ times a day. Exercise H: Hamstring Curls  If told by your health care provider, do this exercise while wearing ankle weights. Begin with __________ weights. Then increase the weight by 1 lb (0.5 kg) increments. Do not wear ankle weights that are more than __________. 1. Lie on your abdomen with your legs straight. 2. Bend your left / right knee as far as you can without feeling pain. Keep your hips flat against the floor. 3. Hold this position for __________ seconds. 4. Slowly lower your leg to the starting position.  Repeat __________ times. Complete this exercise __________ times a day. Exercise I: Squats (Quadriceps) 1. Stand in front of a table, with your feet and knees pointing straight ahead. You may rest your hands on the table for balance but not for support. 2. Slowly bend your knees and lower your hips like you are going to sit in a chair. ? Keep your weight over your heels, not over your toes. ? Keep your lower legs upright so they are parallel with the table  legs. ? Do not let your hips go lower than your knees. ? Do not bend lower than told by your health care provider. ? If your knee pain increases, do not bend as low. 3. Hold the squat position for __________ seconds. 4. Slowly push with your legs to return to standing. Do not use your hands to pull yourself to standing. Repeat __________ times. Complete this exercise __________ times a day. Exercise J: Wall Slides (Quadriceps)  1. Lean your back against a smooth wall or door while you walk your feet out 18-24 inches (46-61 cm) from it. 2. Place your feet hip-width apart. 3. Slowly slide down the wall or door until your knees bend __________ degrees. Keep your knees over your heels, not over your toes. Keep your knees in line with your hips. 4. Hold for __________ seconds. Repeat __________ times. Complete this exercise __________ times a day. Exercise K: Straight Leg Raises -   Hip Abductors 1. Lie on your side with your left / right leg in the top position. Lie so your head, shoulder, knee, and hip line up. You may bend your bottom knee to help you keep your balance. 2. Roll your hips slightly forward so your hips are stacked directly over each other and your left / right knee is facing forward. 3. Leading with your heel, lift your top leg 4-6 inches (10-15 cm). You should feel the muscles in your outer hip lifting. ? Do not let your foot drift forward. ? Do not let your knee roll toward the ceiling. 4. Hold this position for __________ seconds. 5. Slowly return your leg to the starting position. 6. Let your muscles relax completely after each repetition. Repeat __________ times. Complete this exercise __________ times a day. Exercise L: Straight Leg Raises - Hip Extensors 1. Lie on your abdomen on a firm surface. You can put a pillow under your hips if that is more comfortable. 2. Tense the muscles in your buttocks and lift your left / right leg about 4-6 inches (10-15 cm). Keep your knee  straight as you lift your leg. 3. Hold this position for __________ seconds. 4. Slowly lower your leg to the starting position. 5. Let your leg relax completely after each repetition. Repeat __________ times. Complete this exercise __________ times a day. This information is not intended to replace advice given to you by your health care provider. Make sure you discuss any questions you have with your health care provider. Document Released: 08/23/2005 Document Revised: 07/03/2016 Document Reviewed: 08/15/2015 Elsevier Interactive Patient Education  2018 [employer] Iliotibial Bursitis Rehab Ask your health care provider which exercises are safe for you. Do exercises exactly as told by your health care provider and adjust them as directed. It is normal to feel mild stretching, pulling, tightness, or discomfort as you do these exercises, but you should stop right away if you feel sudden pain or your pain gets worse.Do not begin these exercises until told by your health care provider. Stretching and range of motion exercises These exercises warm up your muscles and joints and improve the movement and flexibility of your leg. These exercises also help to relieve pain and stiffness. Exercise A: Quadriceps stretch, prone  1. Lie on your abdomen on a firm surface, such as a bed or padded floor. 2. Bend your left / right knee and hold your ankle. If you cannot reach your ankle or pant leg, loop a belt around your foot and grab the belt instead. 3. Gently pull your heel toward your buttocks. Your knee should not slide out to the side. You should feel a stretch in the front of your thigh and knee. 4. Hold this position for __________ seconds. Repeat __________ times. Complete this exercise __________ times a day. Exercise B: Lunge ( adductor stretch) 1. Stand and spread your legs about 3 feet (about 1 m) apart. Put your left / right leg slightly back for balance. 2. Lean away from your left / right  leg by bending your other knee and shifting your weight toward your bent knee. You may rest your hands on your thigh for balance. You should feel a stretch in your left / right inner thigh. 3. Hold for __________ seconds. Repeat __________ times. Complete this exercise __________ times a day. Exercise C: Hamstring stretch, supine  1. Lie on your back. 2. Hold both ends of a belt or towel as you loop it over the ball of   your left / right foot. The ball of your foot is on the walking surface, right under your toes. 3. Straighten your left / right knee and slowly pull on the belt to raise your leg. Stop when you feel a gentle stretch in the back of your left / right knee or thigh. ? Do not let your left / right knee bend. ? Keep your other leg flat on the floor. 4. Hold this position for __________ seconds. Repeat __________ times. Complete this exercise __________ times a day. Strengthening exercises These exercises build strength and endurance in your leg. Endurance is the ability to use your muscles for a long time, even after they get tired. Exercise D: Quadriceps wall slides  1. Lean your back against a smooth wall or door while you walk your feet out 18-24 inches (46-61 cm) from it. 2. Place your feet hip-width apart. 3. Slowly slide down the wall or door until your knees bend as far as told by your health care provider. Keep your knees over your heels, not your toes. Keep your knees in line with your hips. 4. Hold for __________ seconds. 5. Push through your heels to stand up to rest for __________ seconds after each repetition. Repeat __________ times. Complete this exercise __________ times a day. Exercise E: Straight leg raises ( hip abductors) 1. Lie on your side, with your left / right leg in the top position. Lie so your head, shoulder, knee, and hip line up with each other. You may bend your bottom knee to help you balance. 2. Lift your top leg 4-6 inches (10-15 cm) while keeping  your toes pointed straight ahead. 3. Hold this position for __________ seconds. 4. Slowly lower your leg to the starting position. Allow your muscles to relax completely after each repetition. Repeat __________ times. Complete this exercise __________ times a day. Exercise F: Straight leg raises ( hip extensors) 1. Lie on your abdomen on a firm surface. You can put a pillow under your hips if that is more comfortable. 2. Tense the muscles in your buttocks and lift your left / right leg about 4-6 inches (10-15 cm). Keep your knee straight as you lift your leg. 3. Hold this position for __________ seconds. 4. Slowly lower your leg to the starting position. 5. Let your leg relax completely after each repetition. Repeat __________ times. Complete this exercise __________ times a day. Exercise G: Bridge ( hip extensors) 1. Lie on your back on a firm surface with your knees bent and your feet flat on the floor. 2. Tighten your buttocks muscles and lift your bottom off the floor until your trunk is level with your thighs. ? Do not arch your back. ? You should feel the muscles working in your buttocks and the back of your thighs. If you do not feel these muscles, slide your feet 1-2 inches (2.5-5 cm) farther away from your buttocks. 3. Hold this position for __________ seconds. 4. Slowly lower your hips to the starting position. 5. Let your buttocks muscles relax completely between repetitions. 6. If this exercise is too easy, try doing it with your arms crossed over your chest. Repeat __________ times. Complete this exercise __________ times a day. This information is not intended to replace advice given to you by your health care provider. Make sure you discuss any questions you have with your health care provider. Document Released: 10/09/2005 Document Revised: 06/15/2016 Document Reviewed: 09/21/2015 Elsevier Interactive Patient Education  2018 Elsevier Inc.  

## 2017-08-10 NOTE — Progress Notes (Deleted)
   Office Visit Note  Patient: Deanna Randolph             Date of Birth: November 02, 1960           MRN: [id number]             PCP: Elby Showers, MD Visit Date: 08/10/2017   Assessment: Visit Diagnoses: No diagnosis found.   Follow-Up Instructions: No Follow-up on file.  Orders: No orders of the defined types were placed in this encounter.  No orders of the defined types were placed in this encounter.    Subjective:    Allergies: Patient has no known allergies.   Activities of Daily Living: ***   History of Present Illness: Deanna Randolph is a 56 y.o. female ***   Review of Systems  Constitutional: Negative.  Negative for fatigue.  HENT: Negative for mouth dryness.   Eyes: Negative for dryness.  Respiratory: Negative.  Negative for cough, shortness of breath and difficulty breathing.   Cardiovascular: Negative.  Positive for hypertension. Negative for palpitations and irregular heartbeat.  Gastrointestinal: Negative.  Negative for blood in stool, constipation and diarrhea.  Endocrine: Negative.   Genitourinary: Negative.  Negative for nocturia.  Musculoskeletal: Negative for arthralgias, joint pain, joint swelling, myalgias, muscle weakness, morning stiffness and myalgias.  Skin: Negative.  Negative for color change, rash, hair loss, ulcers and sensitivity to sunlight.  Neurological: Negative.  Negative for headaches.  Hematological: Negative.  Negative for swollen glands.  Psychiatric/Behavioral: Negative.  Negative for depressed mood and sleep disturbance. The patient is not nervous/anxious.      Investigation: No additional findings.   Objective: Vital Signs: BP 130/70   Pulse 66   Resp 12   Ht 5\' 6"  (1.676 m)   Wt 171 lb (77.6 kg)   BMI 27.60 kg/m    Physical Exam   Musculoskeletal Exam: ***  CDAI Exam: No CDAI exam completed.  CDAI comments:  Speciality Comments: No specialty comments available.  Imaging: No results found.   PMFS History:    Patient Active Problem List   Diagnosis Date Noted  . Hypertension 01/01/2012    Past Medical History:  Diagnosis Date  . Hypertension     Family History  Problem Relation Age of Onset  . Cancer Mother    Past Surgical History:  Procedure Laterality Date  . ABDOMINAL HYSTERECTOMY  06/03/2012   Procedure: HYSTERECTOMY ABDOMINAL;  Surgeon: Cyril Mourning, MD;  Location: [facility];  Service: Gynecology;  Laterality: Bilateral;  . DILATION AND CURETTAGE OF UTERUS    . DILATION AND CURETTAGE OF UTERUS  04/17/2012   Procedure: DILATATION AND CURETTAGE;  Surgeon: Cyril Mourning, MD;  Location: [facility];  Service: Gynecology;  Laterality: N/A;  . SALPINGOOPHORECTOMY  06/03/2012   Procedure: SALPINGO OOPHERECTOMY;  Surgeon: Cyril Mourning, MD;  Location: [facility];  Service: Gynecology;  Laterality: Bilateral;   Social History   Social History Narrative  . No narrative on file     Procedures:  No procedures performed  Mervyn Skeeters, EMT  Note - This record has been created using [employer]. Chart creation errors have been sought, but may not always have been located. Such creation errors do not reflect on the standard of medical care.

## 2017-08-13 ENCOUNTER — Ambulatory Visit: Payer: 59 | Admitting: Internal Medicine

## 2017-08-15 ENCOUNTER — Ambulatory Visit (INDEPENDENT_AMBULATORY_CARE_PROVIDER_SITE_OTHER): Payer: BLUE CROSS/BLUE SHIELD | Admitting: Rheumatology

## 2017-08-15 VITALS — BP 151/74 | HR 67

## 2017-08-15 DIAGNOSIS — M7062 Trochanteric bursitis, left hip: Secondary | ICD-10-CM | POA: Diagnosis not present

## 2017-08-15 MED ORDER — LIDOCAINE HCL 1 % IJ SOLN
1.5000 mL | INTRAMUSCULAR | Status: AC | PRN
  Administered 2017-08-15: 1.5 mL

## 2017-08-15 MED ORDER — TRIAMCINOLONE ACETONIDE 40 MG/ML IJ SUSP
40.0000 mg | INTRAMUSCULAR | Status: AC | PRN
  Administered 2017-08-15: 40 mg via INTRA_ARTICULAR

## 2017-08-15 NOTE — Progress Notes (Signed)
   Procedure Note  Patient: Deanna Randolph             Date of Birth: Mar 20, 1961           MRN: [id number]             Visit Date: 08/15/2017  Procedures: Visit Diagnoses: Trochanteric bursitis of left hip Her blood pressure was elevated at 150/74. She states she was very anxious about the injection. She'll monitor blood pressure at home. Large Joint Inj Date/Time: 08/15/2017 12:23 PM Performed by: Bo Merino Authorized by: Bo Merino   Consent Given by:  Patient Site marked: the procedure site was marked   Timeout: prior to procedure the correct patient, procedure, and site was verified   Indications:  Pain Location:  Hip Site:  L greater trochanter Prep: patient was prepped and draped in usual sterile fashion   Needle Size:  27 G Needle Length:  1.5 inches Approach:  Lateral Ultrasound Guidance: No   Fluoroscopic Guidance: No   Arthrogram: No   Medications:  40 mg triamcinolone acetonide 40 MG/ML; 1.5 mL lidocaine 1 % Aspiration Attempted: No   Aspirate amount (mL):  0 Patient tolerance:  Patient tolerated the procedure well with no immediate complications  IT band exercises were discussed. I also advised to call us if her symptoms persist. We will refer her to physical therapy to [facility]. Bo Merino, MD

## 2017-08-28 MED FILL — MINIVELLE 0.05 MG PATCH: 0.05 | 28 days supply | Qty: 8 | Fill #10

## 2017-09-24 MED FILL — LIDOCAINE-HC 3-0.5% CREAM: 3-0.5 | 10 days supply | Qty: 28 | Fill #1

## 2017-10-02 ENCOUNTER — Other Ambulatory Visit: Payer: Self-pay | Admitting: Internal Medicine

## 2017-10-09 DIAGNOSIS — Z01419 Encounter for gynecological examination (general) (routine) without abnormal findings: Secondary | ICD-10-CM | POA: Diagnosis not present

## 2017-10-09 DIAGNOSIS — Z1231 Encounter for screening mammogram for malignant neoplasm of breast: Secondary | ICD-10-CM | POA: Diagnosis not present

## 2017-10-09 DIAGNOSIS — Z6828 Body mass index (BMI) 28.0-28.9, adult: Secondary | ICD-10-CM | POA: Diagnosis not present

## 2017-10-11 MED FILL — ESTRADIOL 0.0375 MG/24HR PT: 0.0375 | 28 days supply | Qty: 8 | Fill #0

## 2017-11-20 ENCOUNTER — Telehealth: Payer: Self-pay

## 2017-11-20 NOTE — Telephone Encounter (Signed)
Left message per Dr. Renold Genta patient is to call [facility] to see if her LOSARTAN was recalled. Left [facility] phone number [phone]. SX#[phone]

## 2017-12-31 ENCOUNTER — Other Ambulatory Visit: Payer: Self-pay | Admitting: Internal Medicine

## 2017-12-31 NOTE — Telephone Encounter (Signed)
Not seen since 2017. Needs PE without pelvic and needs labs

## 2018-01-05 ENCOUNTER — Encounter: Payer: Self-pay | Admitting: Internal Medicine

## 2018-01-07 ENCOUNTER — Other Ambulatory Visit: Payer: Self-pay

## 2018-01-07 MED ORDER — LOSARTAN POTASSIUM 50 MG PO TABS: 50.0000 mg | ORAL_TABLET | Freq: Every day | ORAL | 0 refills | Status: DC

## 2018-01-18 ENCOUNTER — Other Ambulatory Visit: Payer: Self-pay

## 2018-01-18 DIAGNOSIS — Z1329 Encounter for screening for other suspected endocrine disorder: Secondary | ICD-10-CM

## 2018-01-18 DIAGNOSIS — I10 Essential (primary) hypertension: Secondary | ICD-10-CM

## 2018-01-18 DIAGNOSIS — Z13220 Encounter for screening for lipoid disorders: Secondary | ICD-10-CM

## 2018-01-18 DIAGNOSIS — Z1321 Encounter for screening for nutritional disorder: Secondary | ICD-10-CM

## 2018-01-18 DIAGNOSIS — Z0000 Encounter for general adult medical examination without abnormal findings: Secondary | ICD-10-CM

## 2018-02-04 ENCOUNTER — Encounter: Payer: BLUE CROSS/BLUE SHIELD | Admitting: Internal Medicine

## 2018-02-15 ENCOUNTER — Encounter: Payer: Self-pay | Admitting: Internal Medicine

## 2018-02-15 DIAGNOSIS — Z1321 Encounter for screening for nutritional disorder: Secondary | ICD-10-CM | POA: Diagnosis not present

## 2018-02-15 DIAGNOSIS — Z0000 Encounter for general adult medical examination without abnormal findings: Secondary | ICD-10-CM | POA: Diagnosis not present

## 2018-02-15 DIAGNOSIS — Z1329 Encounter for screening for other suspected endocrine disorder: Secondary | ICD-10-CM | POA: Diagnosis not present

## 2018-02-15 DIAGNOSIS — I10 Essential (primary) hypertension: Secondary | ICD-10-CM | POA: Diagnosis not present

## 2018-03-07 ENCOUNTER — Encounter: Payer: Self-pay | Admitting: Internal Medicine

## 2018-03-07 ENCOUNTER — Ambulatory Visit (INDEPENDENT_AMBULATORY_CARE_PROVIDER_SITE_OTHER): Payer: BLUE CROSS/BLUE SHIELD | Admitting: Internal Medicine

## 2018-03-07 VITALS — BP 140/90 | HR 64 | Ht 64.25 in | Wt 170.0 lb

## 2018-03-07 DIAGNOSIS — E559 Vitamin D deficiency, unspecified: Secondary | ICD-10-CM

## 2018-03-07 DIAGNOSIS — H6123 Impacted cerumen, bilateral: Secondary | ICD-10-CM | POA: Diagnosis not present

## 2018-03-07 DIAGNOSIS — I10 Essential (primary) hypertension: Secondary | ICD-10-CM

## 2018-03-07 DIAGNOSIS — Z0000 Encounter for general adult medical examination without abnormal findings: Secondary | ICD-10-CM

## 2018-03-07 DIAGNOSIS — Z8669 Personal history of other diseases of the nervous system and sense organs: Secondary | ICD-10-CM

## 2018-03-07 LAB — POCT URINALYSIS DIPSTICK
Appearance: NORMAL
Bilirubin, UA: NEGATIVE
Glucose, UA: NEGATIVE
KETONES UA: NEGATIVE
Leukocytes, UA: NEGATIVE
NITRITE UA: NEGATIVE
ODOR: NORMAL
PH UA: 7 (ref 5.0–8.0)
PROTEIN UA: NEGATIVE
Spec Grav, UA: 1.015 (ref 1.010–1.025)
UROBILINOGEN UA: 0.2 U/dL

## 2018-03-07 MED ORDER — LOSARTAN POTASSIUM 100 MG PO TABS: 100.0000 mg | ORAL_TABLET | Freq: Every day | ORAL | 0 refills | Status: DC

## 2018-03-08 ENCOUNTER — Telehealth: Payer: Self-pay | Admitting: Internal Medicine

## 2018-03-08 NOTE — Telephone Encounter (Signed)
Scheduled an appointment with Dr Floydene Flock for 12/27/62 @1 :00. Patient is aware

## 2018-03-20 NOTE — Progress Notes (Signed)
Subjective:    Patient ID: Deanna Randolph, female    DOB: 19-Sep-1961, 57 y.o.   MRN: [id number]  HPI Pleasant 57 year old Female in today for health maintenance exam and evaluation of medical issues.  She has a history of migraine headaches and hypertension.  Occasionally gets a urinary tract infection.  Migraines seem to be brought on by certain types of wine.  In October 2018 she saw Dr. Estanislado Pandy for left knee pain and was diagnosed with mild to moderate osteoarthritis and moderate chondromalacia patella and IT syndrome.  Subsequently was diagnosed with trochanteric bursitis left hip which was injected.  She was also given Voltaren gel and a Voltaren patch.  Recent lab work done through [facility] shows normal CBC, fasting glucose was slightly high at 106, LDL cholesterol slightly 107.  TSH is 3.4.  Vitamin D was low at 25.5.  Hemoglobin A1c was normal at 5.2%.  In 2017 she was changed from Ramipril to Cozaar for hypertension.  Currently on 50 mg of Cozaar daily.  Blood pressure is elevated today and I would like to increase it to 100 mg daily with follow-up in 3 months.  She may have an element of office  hypertension in addition to essential hypertension.  History of endometriosis and is status post hysterectomy BSO 2013 for menorrhagia.  Social history: She has an adult daughter.  She is married to Dr. Elayne Snare, endocrinologist.  This is her second marriage.  She does not smoke.  She currently works full-time.  She has cerumen in her ears and would like to be referred to ENT for extraction.  Family history: She has 1 brother and 1 sister.  Mother died when patient was 19 years of age of esophageal cancer.  Father living with history of hip fracture.  Father lives in [place].      Review of Systems migraine headaches     Objective:   Physical Exam  Constitutional: She is oriented to person, place, and time. She appears well-developed and well-nourished. No distress.  HENT:  Head:  Normocephalic and atraumatic.  Right Ear: External ear normal.  Left Ear: External ear normal.  Mouth/Throat: Oropharynx is clear and moist.  Eyes: Pupils are equal, round, and reactive to light. Conjunctivae and EOM are normal. Left eye exhibits no discharge. No scleral icterus.  Neck: Neck supple. No JVD present. No thyromegaly present.  Cardiovascular: Normal rate, regular rhythm, normal heart sounds and intact distal pulses. Exam reveals no gallop and no friction rub.  No murmur heard. Pulmonary/Chest: Effort normal and breath sounds normal. No stridor. No respiratory distress. She has no wheezes.  Abdominal: Soft. She exhibits no distension and no mass. There is no tenderness. There is no rebound and no guarding.  Genitourinary:  Genitourinary Comments: Deferred status post TAH/BSO  Musculoskeletal: She exhibits no edema.  Lymphadenopathy:    She has no cervical adenopathy.  Neurological: She is alert and oriented to person, place, and time. She displays normal reflexes. No cranial nerve deficit or sensory deficit. She exhibits normal muscle tone. Coordination normal.  Skin: Skin is warm and dry. She is not diaphoretic.  Psychiatric: She has a normal mood and affect. Her behavior is normal. Judgment and thought content normal.  Vitals reviewed.         Assessment & Plan:  Normal health maintenance exam  Essential hypertension-increase losartan to 100 mg daily and follow-up in a couple of months.  She prefers Fridays when she can work from home.  Cerumen impaction-  refer to Dr. Erik Obey for extraction  History of migraine headaches treated with triptan medication  Vitamin D deficiency-take 2000 units vitamin D3 daily  Plan: See above but will need follow-up of hypertension on increased dose of losartan

## 2018-03-20 NOTE — Patient Instructions (Signed)
Take 2000 units vitamin D3 daily.  Increase losartan from 50 to 100 mg daily and follow-up in 8 weeks or so.  Referred to Dr. Erik Obey for cerumen extraction.

## 2018-03-22 ENCOUNTER — Telehealth: Payer: Self-pay | Admitting: Internal Medicine

## 2018-03-22 NOTE — Telephone Encounter (Signed)
Faxed demographic/clinical information to Dr. Noreene Filbert office to schedule appointment for patient.  Dx: Impacted Cerumen.  Requested appointment for as soon as possible for doctor.  Received a phone call from [place] at office.  Per Myrlene Broker at Dr. Noreene Filbert office, patient cancelled the 6/7 appointment per the auto cancellation system.  No reason given or provided on the auto cancellation system.  We rescheduled for a new appointment at Dr. Verlene Mayer request for impacted cerumen.  New referral being put into Epic today.  Myrlene Broker will reschedule at my request. New appointment scheduled for 6/14 @ 3:40.  Dr. Renold Genta made aware of the cancellation and new appointment.  Patient contacted about the new appointment.   Patient states that she will contact them to re-schedule yet again.  Advised patient that she will have to call them to reschedule and I will make Dr. Renold Genta aware of cancellation #2.  Not sure if they will R/S her or not since she is cancelling for the 2nd time.

## 2018-03-22 NOTE — Telephone Encounter (Signed)
Pt aware. She can reschedule at her convenience.

## 2018-03-22 NOTE — Telephone Encounter (Signed)
Noted  

## 2018-04-03 ENCOUNTER — Encounter: Payer: Self-pay | Admitting: Internal Medicine

## 2018-04-05 ENCOUNTER — Other Ambulatory Visit: Payer: Self-pay

## 2018-04-05 MED ORDER — LIDOCAINE HCL 3 % EX CREA: 1.0000 "application " | TOPICAL_CREAM | CUTANEOUS | 11 refills | Status: DC | PRN

## 2018-04-05 MED ORDER — HYDROCORTISONE ACETATE 0.5 % EX CREA: TOPICAL_CREAM | Freq: Two times a day (BID) | CUTANEOUS | 11 refills | Status: DC

## 2018-04-19 ENCOUNTER — Encounter: Payer: BLUE CROSS/BLUE SHIELD | Admitting: Internal Medicine

## 2018-05-03 DIAGNOSIS — H6062 Unspecified chronic otitis externa, left ear: Secondary | ICD-10-CM | POA: Diagnosis not present

## 2018-05-03 DIAGNOSIS — H6122 Impacted cerumen, left ear: Secondary | ICD-10-CM | POA: Diagnosis not present

## 2018-05-09 ENCOUNTER — Ambulatory Visit: Payer: BLUE CROSS/BLUE SHIELD | Admitting: Internal Medicine

## 2018-06-07 ENCOUNTER — Other Ambulatory Visit: Payer: Self-pay

## 2018-06-07 MED ORDER — LOSARTAN POTASSIUM 100 MG PO TABS: 100.0000 mg | ORAL_TABLET | Freq: Every day | ORAL | 3 refills | Status: DC

## 2018-06-14 ENCOUNTER — Encounter: Payer: Self-pay | Admitting: Internal Medicine

## 2018-06-14 DIAGNOSIS — Z8669 Personal history of other diseases of the nervous system and sense organs: Secondary | ICD-10-CM | POA: Diagnosis not present

## 2018-06-14 DIAGNOSIS — E559 Vitamin D deficiency, unspecified: Secondary | ICD-10-CM | POA: Diagnosis not present

## 2018-06-14 DIAGNOSIS — I10 Essential (primary) hypertension: Secondary | ICD-10-CM | POA: Diagnosis not present

## 2018-06-18 ENCOUNTER — Other Ambulatory Visit: Payer: Self-pay

## 2018-06-18 MED ORDER — LOSARTAN POTASSIUM 100 MG PO TABS: 100.0000 mg | ORAL_TABLET | Freq: Every day | ORAL | 3 refills | Status: DC

## 2018-06-21 ENCOUNTER — Ambulatory Visit (INDEPENDENT_AMBULATORY_CARE_PROVIDER_SITE_OTHER): Payer: BLUE CROSS/BLUE SHIELD | Admitting: Internal Medicine

## 2018-06-21 ENCOUNTER — Encounter: Payer: Self-pay | Admitting: Internal Medicine

## 2018-06-21 VITALS — BP 120/70 | HR 71 | Ht 64.5 in | Wt 170.0 lb

## 2018-06-21 DIAGNOSIS — Z8669 Personal history of other diseases of the nervous system and sense organs: Secondary | ICD-10-CM | POA: Diagnosis not present

## 2018-06-21 DIAGNOSIS — I10 Essential (primary) hypertension: Secondary | ICD-10-CM | POA: Diagnosis not present

## 2018-06-21 DIAGNOSIS — E559 Vitamin D deficiency, unspecified: Secondary | ICD-10-CM

## 2018-06-21 DIAGNOSIS — Z23 Encounter for immunization: Secondary | ICD-10-CM

## 2018-06-21 NOTE — Progress Notes (Signed)
   Subjective:    Patient ID: Deanna Randolph, female    DOB: 05/19/1961, 57 y.o.   MRN: [id number]  HPI Deanna Randolph was here in May for health maintenance exam.  She has a history of migraine headaches and hypertension.  At that time we increased her losartan to 100 mg daily as blood pressure was 140/90.  She is here for follow-up.  She brings in lab reports from [place] including BUN and creatinine which are normal.  Flu vaccine given today.  She plans to go to [place] in the near future with her husband to visit family.  Things are going well for her and she looks great.  Blood pressure is excellent at 120/70.  LDL cholesterol 107.  Normal hemoglobin A1c.  Fasting glucose 106.  Vitamin D level low at 25.  Recommend she speak with her husband who is an endocrinologist about appropriate dose of vitamin D daily for her.  She thinks she may be taking 5000 units a couple of times a week.    Review of Systems     Objective:   Physical Exam Labs reviewed.  Not examined.  Doing well.       Assessment & Plan:  Essential hypertension-stable on current regimen-she will continue to monitor this at home.  History of migraine headaches  Vitamin D deficiency-needs to increase dose of vitamin D  Elevated serum glucose at 106 but normal hemoglobin A1c  Plan: Book physical exam May 2020

## 2018-06-21 NOTE — Patient Instructions (Signed)
Flu vaccine given.  It was a pleasure to see you.  Continue same medications.  Make sure you are taking enough weekly vitamin D.  Follow-up May 2020.

## 2018-10-14 DIAGNOSIS — Z1231 Encounter for screening mammogram for malignant neoplasm of breast: Secondary | ICD-10-CM | POA: Diagnosis not present

## 2018-10-14 DIAGNOSIS — Z01419 Encounter for gynecological examination (general) (routine) without abnormal findings: Secondary | ICD-10-CM | POA: Diagnosis not present

## 2018-10-14 DIAGNOSIS — Z6828 Body mass index (BMI) 28.0-28.9, adult: Secondary | ICD-10-CM | POA: Diagnosis not present

## 2018-12-16 ENCOUNTER — Telehealth: Payer: Self-pay | Admitting: Internal Medicine

## 2018-12-16 MED ORDER — OSELTAMIVIR PHOSPHATE 75 MG PO CAPS: 75.0000 mg | ORAL_CAPSULE | Freq: Every day | ORAL | 0 refills | Status: DC

## 2018-12-16 NOTE — Telephone Encounter (Signed)
Dr. Dwyane Dee is calling stating he has been diagnosed with the flu today.  He would like for the patient to have Tamiflu since he has been diagnosed.  Advised I would send request back to you.    Pharmacy:  [facility] at [place].    Thank you.

## 2018-12-16 NOTE — Telephone Encounter (Signed)
Will call in Tamiflu for Deanna Randolph prophylactic dose.

## 2018-12-18 ENCOUNTER — Telehealth: Payer: Self-pay

## 2018-12-18 NOTE — Telephone Encounter (Signed)
Patient called states her BP has been running high since yesterday she said she is at home taking care of her husband who is sick with the flu and she thinks this is stressing her out. Yesterday her BP was 156/92, she took losartan this morning around 8am and it was 146/93 she had called around 9:10am I advised patient to recheck her BP 3 hours after she had taken losartan then call back with reading she said it was 141/91 she said she has amlodipine 5mg  and she wants to know if you want her to take that too?

## 2018-12-18 NOTE — Telephone Encounter (Signed)
Left detailed message letting her know of Dr. Verlene Mayer instructions.

## 2018-12-18 NOTE — Telephone Encounter (Signed)
I think it best not to start something now as it might just be stress. See how it is when he is better.

## 2019-03-13 ENCOUNTER — Encounter: Payer: BLUE CROSS/BLUE SHIELD | Admitting: Internal Medicine

## 2019-09-03 ENCOUNTER — Other Ambulatory Visit: Payer: Self-pay | Admitting: Internal Medicine

## 2019-09-03 NOTE — Telephone Encounter (Signed)
Mrs. Burdine is past due for PE without GYN exam. Last seen August 2019. When can she come for brief CPE. [facility] can draw labs or she can get them elsewhere.

## 2019-09-04 NOTE — Telephone Encounter (Signed)
Has CPE scheduled in February.

## 2019-10-31 ENCOUNTER — Telehealth: Payer: Self-pay

## 2019-10-31 MED ORDER — AMLODIPINE BESYLATE 5 MG PO TABS: 5.0000 mg | ORAL_TABLET | Freq: Every day | ORAL | 0 refills | Status: DC

## 2019-10-31 NOTE — Telephone Encounter (Signed)
Amlodipine 5 mg daily #90 with no refill

## 2019-10-31 NOTE — Telephone Encounter (Signed)
Patient's husband called to request amlodipine to be sent, he said the one he has has expired.  [facility] on [address]

## 2019-11-11 ENCOUNTER — Encounter: Payer: Self-pay | Admitting: Internal Medicine

## 2019-11-11 ENCOUNTER — Ambulatory Visit (INDEPENDENT_AMBULATORY_CARE_PROVIDER_SITE_OTHER): Payer: BC Managed Care – PPO | Admitting: Internal Medicine

## 2019-11-11 VITALS — BP 145/91 | HR 79 | Ht 64.5 in | Wt 166.0 lb

## 2019-11-11 DIAGNOSIS — Z8669 Personal history of other diseases of the nervous system and sense organs: Secondary | ICD-10-CM

## 2019-11-11 DIAGNOSIS — I10 Essential (primary) hypertension: Secondary | ICD-10-CM

## 2019-11-11 MED ORDER — HYDROCHLOROTHIAZIDE 25 MG PO TABS: 25.0000 mg | ORAL_TABLET | Freq: Every day | ORAL | 0 refills | Status: DC

## 2019-11-11 NOTE — Progress Notes (Signed)
   Subjective:    Patient ID: Deanna Randolph, female    DOB: [birth year], 59 y.o.   MRN: [id number]  HPI 59 year old Female seen today by [employer] for management of hypertension due to the coronavirus pandemic.  She is identified using 2 identifiers as Deanna Randolph, a patient in this practice.  She is agreeable to visit in this format today. Has been having headaches and elevated blood pressure.  She is working from home.  She has a history of hypertension but was well controlled until recently.  History of migraine type headaches from time to time.  Previously had been on losartan 100 mg daily.  She is not on diuretic.  We are going to add HCTZ 25 mg daily.  Would like for her to take amlodipine 5 mg daily.  Follow-up in early February.   Review of Systems no nausea vomiting or photophobia.  Does seem stressed with work project.     Objective:   Physical Exam Blood pressure reportedly 145/91 weight 166 pounds pulse 79 Seen virtually.  Expresses situational stress with work project.      Assessment & Plan:  Essential hypertension-blood pressure has been elevated recently  Headaches-history of migraine headaches.  Elevated blood pressure could be the cause of her current headache.  Plan: Continue amlodipine 5 mg daily and losartan 100 mg daily.  Add HCTZ 25 mg daily and follow-up early February.  Monitor blood pressure at home and call if persistently elevated.

## 2019-11-24 ENCOUNTER — Other Ambulatory Visit: Payer: Self-pay

## 2019-11-24 ENCOUNTER — Encounter: Payer: Self-pay | Admitting: Internal Medicine

## 2019-11-24 ENCOUNTER — Ambulatory Visit (INDEPENDENT_AMBULATORY_CARE_PROVIDER_SITE_OTHER): Payer: BC Managed Care – PPO | Admitting: Internal Medicine

## 2019-11-24 VITALS — Ht 64.5 in | Wt 166.0 lb

## 2019-11-24 DIAGNOSIS — Z8669 Personal history of other diseases of the nervous system and sense organs: Secondary | ICD-10-CM

## 2019-11-24 DIAGNOSIS — I10 Essential (primary) hypertension: Secondary | ICD-10-CM

## 2019-11-24 MED ORDER — HYDROCHLOROTHIAZIDE 25 MG PO TABS: ORAL_TABLET | ORAL | 0 refills | Status: DC

## 2019-11-24 NOTE — Progress Notes (Signed)
   Subjective:    Patient ID: Deanna Randolph, female    DOB: [birth year], 59 y.o.   MRN: [id number]  HPI 59 year old Female with history of hypertension. Has been working from home on large work project.  At last virtual visit January 19, had been having headaches and elevated BP.    Her work project  will not be over for a few months.   Currently on Losartan 100 mg daily and taking one half of 25 mg HCTZ  tab. Discontinued amlodipine. BP has been excellent recently and she seems less stressed. Headaches have improved.  In 2017 she was changed from Ramipril to losartan for hypertension.  Initially was on 50 mg of losartan and dose was increased to 100 mg in May 2019. At last visit, we added HCTZ. Had discussed adding amlodipine with her husband on phone prior to last visit as he called to say BP was elevated and patient was having headaches. She has since discontinued amlodipine and says BP has been very good just with 12.5 mg HCTZ and Losartan 100 mg daily.  Due to the Coronavirus pandemic, patient seen today by [employer].  She is identified using 2 identifiers as Deanna Randolph, a patient in this practice.  She is agreeable to visit in this format today.  Patient has history of migraine type headaches from time to time.  Review of Systems had similar issues with BP elevation Feb 2020 see note in Epic     Objective:   Physical Exam Seen virtually today.  She is in no acute distress but verbalizes situational stress with work project.       Assessment & Plan:  Essential hypertension- stable on current regimen HCTZ 12.5 mg daily and losartan 100 mg daily.  Elevated blood pressure readings- markedly improved and have normalized  Situational stress with work project- seems less stressed  Headache- improved-? Migraine and/ or HTN related  Plan:: Patient would like to cancel upcoming CPE but agrees to have fasting labs at [facility] near her home. Order  mailed to her for these labs.

## 2019-11-25 ENCOUNTER — Other Ambulatory Visit: Payer: Self-pay | Admitting: Internal Medicine

## 2019-12-15 ENCOUNTER — Encounter: Payer: BLUE CROSS/BLUE SHIELD | Admitting: Internal Medicine

## 2019-12-15 NOTE — Patient Instructions (Signed)
I am pleased her blood pressure has improved.  Continue HCTZ 12.5 mg daily and losartan 100 mg daily.  Please have lab work [facility] near your home in the near future.  Order has been mailed to you.

## 2019-12-16 NOTE — Patient Instructions (Signed)
Please take amlodipine 5 mg daily, losartan 100 mg daily and HCTZ 25 mg daily and follow-up in early February.

## 2020-01-13 DIAGNOSIS — I10 Essential (primary) hypertension: Secondary | ICD-10-CM | POA: Diagnosis not present

## 2020-05-19 ENCOUNTER — Encounter: Payer: Self-pay | Admitting: Internal Medicine

## 2020-05-19 ENCOUNTER — Telehealth: Payer: Self-pay | Admitting: Internal Medicine

## 2020-05-19 DIAGNOSIS — R7989 Other specified abnormal findings of blood chemistry: Secondary | ICD-10-CM

## 2020-05-19 MED ORDER — LOSARTAN POTASSIUM 100 MG PO TABS: 100.0000 mg | ORAL_TABLET | Freq: Every day | ORAL | 0 refills | Status: DC

## 2020-05-19 NOTE — Telephone Encounter (Signed)
Left patient a voice mail message. Need to book PE no Pap. Have not seen her recently. Has had labs at [facility]. TSH is elevated. Would like for her to have that repeated. Mailing her an order to go to [facility] near her home. Does not have to fast. Refill meds x 90 days only pending OV/brief PE

## 2020-05-19 NOTE — Telephone Encounter (Signed)
Received Fax RX request from  [facility] [place] Phone:  [phone]  Fax:  [phone]       Medication - losartan (COZAAR) 100 MG tablet   Last Refill -   Last OV - 11/24/19  Last CPE - 03/07/18  Next Appointment -

## 2020-05-21 NOTE — Telephone Encounter (Signed)
scheduled

## 2020-06-15 ENCOUNTER — Telehealth: Payer: Self-pay | Admitting: Internal Medicine

## 2020-06-15 MED ORDER — CYCLOBENZAPRINE HCL 10 MG PO TABS: 10.0000 mg | ORAL_TABLET | Freq: Three times a day (TID) | ORAL | 0 refills | Status: DC | PRN

## 2020-06-15 NOTE — Telephone Encounter (Signed)
Refill Flexeril and let her know

## 2020-06-15 NOTE — Telephone Encounter (Signed)
Deanna Randolph [phone]  [name] called to say she has had pain in her neck for a few days and last night it got worse, Bengay did not help at all. She woke up with creek in her neck, it hurts to move it very much. She would like for you to call her in some muscle relaxer to below pharmacy.  [facility] on [address]

## 2020-06-29 ENCOUNTER — Other Ambulatory Visit: Payer: Self-pay

## 2020-06-30 DIAGNOSIS — R7989 Other specified abnormal findings of blood chemistry: Secondary | ICD-10-CM | POA: Diagnosis not present

## 2020-06-30 DIAGNOSIS — E039 Hypothyroidism, unspecified: Secondary | ICD-10-CM | POA: Diagnosis not present

## 2020-06-30 DIAGNOSIS — I10 Essential (primary) hypertension: Secondary | ICD-10-CM | POA: Diagnosis not present

## 2020-06-30 MED ORDER — HYDROCHLOROTHIAZIDE 12.5 MG PO TABS: 12.5000 mg | ORAL_TABLET | Freq: Every day | ORAL | 0 refills | Status: DC

## 2020-07-01 ENCOUNTER — Encounter: Payer: Self-pay | Admitting: Internal Medicine

## 2020-07-01 ENCOUNTER — Other Ambulatory Visit: Payer: Self-pay

## 2020-07-01 ENCOUNTER — Ambulatory Visit (INDEPENDENT_AMBULATORY_CARE_PROVIDER_SITE_OTHER): Payer: BC Managed Care – PPO | Admitting: Internal Medicine

## 2020-07-01 VITALS — HR 68 | Ht 64.0 in | Wt 170.0 lb

## 2020-07-01 DIAGNOSIS — I10 Essential (primary) hypertension: Secondary | ICD-10-CM | POA: Diagnosis not present

## 2020-07-01 DIAGNOSIS — Z8669 Personal history of other diseases of the nervous system and sense organs: Secondary | ICD-10-CM

## 2020-07-01 DIAGNOSIS — Z0000 Encounter for general adult medical examination without abnormal findings: Secondary | ICD-10-CM

## 2020-07-21 NOTE — Progress Notes (Signed)
Subjective:    Patient ID: Deanna Randolph, female    DOB: 09-16-1961, 59 y.o.   MRN: [id number]  HPI 59 year old Female for health maintenance exam and evaluation of medical issues.  She has a history of hypertension and migraine headaches.  In October 2018 she saw Dr. Estanislado Pandy for left knee pain and was diagnosed with mild to moderate osteoarthritis and moderate chondromalacia patella and IT syndrome.  Subsequently was diagnosed with trochanteric bursitis of the left hip which was injected.  Was given Voltaren gel and the Voltaren patch.  In 2017 she was changed from Ramipril to Cozaar for hypertension.  She also takes HCTZ 25 mg daily.  History of endometriosis and is status post hysterectomy BSO 2013 for menorrhagia.  Has cerumen in her ears removed by ENT physician  Social history: She has an adult daughter.  She is married to Dr. Elayne Snare, endocrinologist.  This is her second marriage.  She does not smoke.  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  Family history: Father living in [place] and she face times him every day.  History of hip fracture.  She has 1 brother and 1 sister.  Mother died when patient was 65 years of age and esophageal cancer.    Review of Systems  Constitutional: Negative.   Respiratory: Negative.   Cardiovascular: Negative.   Gastrointestinal: Negative.   Genitourinary: Negative.        Objective:   Physical Exam Vitals reviewed.  Constitutional:      Appearance: Normal appearance.  HENT:     Head: Normocephalic.     Right Ear: Tympanic membrane normal.     Left Ear: Tympanic membrane normal.     Nose: Nose normal.     Mouth/Throat:     Mouth: Mucous membranes are moist.  Eyes:     General: No scleral icterus.       Right eye: No discharge.        Left eye: No discharge.     Extraocular Movements: Extraocular movements intact.     Pupils: Pupils are equal, round, and reactive to light.  Cardiovascular:     Rate and  Rhythm: Normal rate and regular rhythm.     Pulses: Normal pulses.     Heart sounds: Normal heart sounds. No murmur heard.   Pulmonary:     Effort: Pulmonary effort is normal.     Breath sounds: Normal breath sounds. No wheezing or rales.  Abdominal:     General: There is no distension.     Palpations: Abdomen is soft.     Tenderness: There is no guarding or rebound.     Hernia: No hernia is present.  Musculoskeletal:     Cervical back: Neck supple.     Right lower leg: No edema.     Left lower leg: No edema.  Lymphadenopathy:     Cervical: No cervical adenopathy.  Skin:    General: Skin is warm and dry.     Findings: No rash.  Neurological:     General: No focal deficit present.     Mental Status: She is alert and oriented to person, place, and time.     Cranial Nerves: No cranial nerve deficit.  Psychiatric:        Mood and Affect: Mood normal.        Behavior: Behavior normal.        Thought Content: Thought content normal.  Judgment: Judgment normal.           Assessment & Plan:  Essential hypertension-stable.  She monitors her blood pressure at home and says is under good control.  Continue losartan and HCTZ.  Migraine headaches  History of musculoskeletal pain in the neck treated with Flexeril  Health maintenance: She has had 2 COVID-19 vaccines.  Plan: Continue current medications and follow-up in 6 months.

## 2020-07-21 NOTE — Patient Instructions (Signed)
It was a pleasure to see you today.  Continue current medications and follow-up in 6 months. 

## 2020-08-31 DIAGNOSIS — Z1231 Encounter for screening mammogram for malignant neoplasm of breast: Secondary | ICD-10-CM | POA: Diagnosis not present

## 2020-08-31 DIAGNOSIS — Z6829 Body mass index (BMI) 29.0-29.9, adult: Secondary | ICD-10-CM | POA: Diagnosis not present

## 2020-08-31 DIAGNOSIS — Z01419 Encounter for gynecological examination (general) (routine) without abnormal findings: Secondary | ICD-10-CM | POA: Diagnosis not present

## 2020-09-07 ENCOUNTER — Other Ambulatory Visit: Payer: Self-pay

## 2020-09-07 ENCOUNTER — Other Ambulatory Visit: Payer: Self-pay | Admitting: Obstetrics and Gynecology

## 2020-09-07 DIAGNOSIS — R928 Other abnormal and inconclusive findings on diagnostic imaging of breast: Secondary | ICD-10-CM

## 2020-09-07 MED ORDER — LOSARTAN POTASSIUM 100 MG PO TABS: 100.0000 mg | ORAL_TABLET | Freq: Every day | ORAL | 1 refills | Status: DC

## 2020-09-21 ENCOUNTER — Other Ambulatory Visit: Payer: Self-pay

## 2020-09-21 ENCOUNTER — Ambulatory Visit
Admission: RE | Admit: 2020-09-21 | Discharge: 2020-09-21 | Disposition: A | Discharge status: Home / Self Care | Payer: BC Managed Care – PPO | Source: Ambulatory Visit | Attending: Obstetrics and Gynecology | Admitting: Obstetrics and Gynecology

## 2020-09-21 DIAGNOSIS — N6012 Diffuse cystic mastopathy of left breast: Secondary | ICD-10-CM | POA: Diagnosis not present

## 2020-09-21 DIAGNOSIS — R928 Other abnormal and inconclusive findings on diagnostic imaging of breast: Secondary | ICD-10-CM

## 2020-09-21 DIAGNOSIS — R922 Inconclusive mammogram: Secondary | ICD-10-CM | POA: Diagnosis not present

## 2020-09-22 ENCOUNTER — Other Ambulatory Visit (HOSPITAL_COMMUNITY): Payer: Self-pay | Admitting: Internal Medicine

## 2020-09-22 ENCOUNTER — Ambulatory Visit: Payer: BC Managed Care – PPO | Attending: Internal Medicine

## 2020-09-22 DIAGNOSIS — Z23 Encounter for immunization: Secondary | ICD-10-CM

## 2020-09-22 NOTE — Progress Notes (Signed)
   Covid-19 Vaccination Clinic  Name:  Deanna Randolph    MRN: [id number] DOB: 1960/10/26  09/22/2020  Ms. Salts was observed post Covid-19 immunization for 15 minutes without incident. She was provided with Vaccine Information Sheet and instruction to access the V-Safe system.   [name] was instructed to call 911 with any severe reactions post vaccine: Marland Kitchen Difficulty breathing  . Swelling of face and throat  . A fast heartbeat  . A bad rash all over body  . Dizziness and weakness   Immunizations Administered    No immunizations on file.

## 2020-09-24 ENCOUNTER — Other Ambulatory Visit: Payer: Self-pay | Admitting: Internal Medicine

## 2020-10-13 ENCOUNTER — Ambulatory Visit: Payer: Self-pay

## 2020-11-23 ENCOUNTER — Telehealth: Payer: Self-pay | Admitting: Internal Medicine

## 2020-11-23 MED ORDER — ZOLPIDEM TARTRATE 5 MG PO TABS: 5.0000 mg | ORAL_TABLET | Freq: Every evening | ORAL | 1 refills | Status: DC | PRN

## 2020-11-23 NOTE — Telephone Encounter (Signed)
Pend Ambien 5 mg (#12) one po qhs prn sleep. No OV needed. Let Mrs Kubicek know we have called this in for her travel.

## 2020-11-23 NOTE — Telephone Encounter (Signed)
Let her know I sent Ambien in to [facility]

## 2020-11-23 NOTE — Telephone Encounter (Signed)
Deanna Randolph [phone]  Deanna Randolph called to see if she could get a prescription for Ambim 5 mg, for sleep, she is going out of town on Friday and she does not sleep well when she is out of town. So she would like something to help with that. I let her know she mayneed an appointment to discuss since this was something she had not had in the past.

## 2020-12-20 ENCOUNTER — Other Ambulatory Visit: Payer: Self-pay | Admitting: Internal Medicine

## 2021-02-25 ENCOUNTER — Telehealth: Payer: Self-pay | Admitting: Internal Medicine

## 2021-02-25 NOTE — Telephone Encounter (Signed)
Dr Elayne Snare [phone]  Dr Dwyane Dee called to say that [name] has been having abdominal pain today. I let him know you were in a meeting this afternoon and he said they would call back on Monday.

## 2021-02-28 DIAGNOSIS — K219 Gastro-esophageal reflux disease without esophagitis: Secondary | ICD-10-CM | POA: Diagnosis not present

## 2021-02-28 DIAGNOSIS — R1033 Periumbilical pain: Secondary | ICD-10-CM | POA: Diagnosis not present

## 2021-02-28 DIAGNOSIS — Z1211 Encounter for screening for malignant neoplasm of colon: Secondary | ICD-10-CM | POA: Diagnosis not present

## 2021-02-28 DIAGNOSIS — R1011 Right upper quadrant pain: Secondary | ICD-10-CM | POA: Diagnosis not present

## 2021-02-28 DIAGNOSIS — R1032 Left lower quadrant pain: Secondary | ICD-10-CM | POA: Diagnosis not present

## 2021-03-01 ENCOUNTER — Other Ambulatory Visit: Payer: Self-pay | Admitting: Gastroenterology

## 2021-03-01 DIAGNOSIS — R1011 Right upper quadrant pain: Secondary | ICD-10-CM

## 2021-03-02 ENCOUNTER — Telehealth: Payer: Self-pay | Admitting: Internal Medicine

## 2021-03-02 ENCOUNTER — Ambulatory Visit
Admission: RE | Admit: 2021-03-02 | Discharge: 2021-03-02 | Disposition: A | Discharge status: Home / Self Care | Payer: BC Managed Care – PPO | Source: Ambulatory Visit | Attending: Gastroenterology | Admitting: Gastroenterology

## 2021-03-02 ENCOUNTER — Encounter: Payer: Self-pay | Admitting: Internal Medicine

## 2021-03-02 ENCOUNTER — Other Ambulatory Visit: Payer: Self-pay | Admitting: Gastroenterology

## 2021-03-02 DIAGNOSIS — R1011 Right upper quadrant pain: Secondary | ICD-10-CM

## 2021-03-02 DIAGNOSIS — R935 Abnormal findings on diagnostic imaging of other abdominal regions, including retroperitoneum: Secondary | ICD-10-CM

## 2021-03-02 NOTE — Telephone Encounter (Signed)
Spoke with patient today. Had attack of abdominal pain last week and also while in [place] recently. Dr. Collene Mares has seen her recently.  Patient has distended gallbladder, sludge and stones on ultrasound done today.  Patient has upcoming appt with Dr. Nedra Hai.  MJB,MD

## 2021-03-08 ENCOUNTER — Ambulatory Visit (HOSPITAL_COMMUNITY): Admission: RE | Admit: 2021-03-08 | Payer: BC Managed Care – PPO | Source: Ambulatory Visit

## 2021-03-08 ENCOUNTER — Encounter (HOSPITAL_COMMUNITY): Payer: Self-pay

## 2021-03-08 ENCOUNTER — Other Ambulatory Visit: Payer: Self-pay

## 2021-03-08 ENCOUNTER — Other Ambulatory Visit (HOSPITAL_COMMUNITY): Payer: Self-pay | Admitting: Surgery

## 2021-03-08 ENCOUNTER — Other Ambulatory Visit: Payer: Self-pay | Admitting: Surgery

## 2021-03-08 DIAGNOSIS — K5792 Diverticulitis of intestine, part unspecified, without perforation or abscess without bleeding: Secondary | ICD-10-CM

## 2021-03-08 DIAGNOSIS — R1032 Left lower quadrant pain: Secondary | ICD-10-CM | POA: Diagnosis not present

## 2021-03-08 DIAGNOSIS — R109 Unspecified abdominal pain: Secondary | ICD-10-CM | POA: Diagnosis not present

## 2021-03-08 DIAGNOSIS — R1013 Epigastric pain: Secondary | ICD-10-CM | POA: Diagnosis not present

## 2021-03-09 ENCOUNTER — Ambulatory Visit (HOSPITAL_COMMUNITY)
Admission: RE | Admit: 2021-03-09 | Discharge: 2021-03-09 | Disposition: A | Discharge status: Home / Self Care | Payer: BC Managed Care – PPO | Source: Ambulatory Visit | Attending: Surgery | Admitting: Surgery

## 2021-03-09 ENCOUNTER — Encounter (HOSPITAL_COMMUNITY): Payer: Self-pay

## 2021-03-09 ENCOUNTER — Ambulatory Visit (HOSPITAL_COMMUNITY): Payer: BC Managed Care – PPO

## 2021-03-09 DIAGNOSIS — Z48815 Encounter for surgical aftercare following surgery on the digestive system: Secondary | ICD-10-CM | POA: Diagnosis not present

## 2021-03-09 DIAGNOSIS — K5792 Diverticulitis of intestine, part unspecified, without perforation or abscess without bleeding: Secondary | ICD-10-CM

## 2021-03-09 DIAGNOSIS — K810 Acute cholecystitis: Secondary | ICD-10-CM | POA: Diagnosis not present

## 2021-03-09 DIAGNOSIS — K8012 Calculus of gallbladder with acute and chronic cholecystitis without obstruction: Secondary | ICD-10-CM | POA: Diagnosis not present

## 2021-03-09 DIAGNOSIS — K8062 Calculus of gallbladder and bile duct with acute cholecystitis without obstruction: Secondary | ICD-10-CM | POA: Diagnosis not present

## 2021-03-09 DIAGNOSIS — K8050 Calculus of bile duct without cholangitis or cholecystitis without obstruction: Secondary | ICD-10-CM | POA: Diagnosis not present

## 2021-03-09 DIAGNOSIS — Z79899 Other long term (current) drug therapy: Secondary | ICD-10-CM | POA: Diagnosis not present

## 2021-03-09 DIAGNOSIS — Z881 Allergy status to other antibiotic agents status: Secondary | ICD-10-CM | POA: Diagnosis not present

## 2021-03-09 DIAGNOSIS — Z90710 Acquired absence of both cervix and uterus: Secondary | ICD-10-CM | POA: Diagnosis not present

## 2021-03-09 DIAGNOSIS — K8020 Calculus of gallbladder without cholecystitis without obstruction: Secondary | ICD-10-CM | POA: Diagnosis not present

## 2021-03-09 DIAGNOSIS — R1013 Epigastric pain: Secondary | ICD-10-CM | POA: Diagnosis not present

## 2021-03-09 DIAGNOSIS — K5900 Constipation, unspecified: Secondary | ICD-10-CM | POA: Diagnosis not present

## 2021-03-09 DIAGNOSIS — K7689 Other specified diseases of liver: Secondary | ICD-10-CM | POA: Diagnosis not present

## 2021-03-09 DIAGNOSIS — Z79890 Hormone replacement therapy (postmenopausal): Secondary | ICD-10-CM | POA: Diagnosis not present

## 2021-03-09 DIAGNOSIS — K8042 Calculus of bile duct with acute cholecystitis without obstruction: Secondary | ICD-10-CM | POA: Diagnosis not present

## 2021-03-09 DIAGNOSIS — Z9049 Acquired absence of other specified parts of digestive tract: Secondary | ICD-10-CM | POA: Diagnosis not present

## 2021-03-09 DIAGNOSIS — F40240 Claustrophobia: Secondary | ICD-10-CM | POA: Diagnosis not present

## 2021-03-09 DIAGNOSIS — R710 Precipitous drop in hematocrit: Secondary | ICD-10-CM | POA: Diagnosis not present

## 2021-03-09 DIAGNOSIS — I10 Essential (primary) hypertension: Secondary | ICD-10-CM | POA: Diagnosis not present

## 2021-03-09 DIAGNOSIS — R932 Abnormal findings on diagnostic imaging of liver and biliary tract: Secondary | ICD-10-CM | POA: Diagnosis not present

## 2021-03-09 DIAGNOSIS — K828 Other specified diseases of gallbladder: Secondary | ICD-10-CM | POA: Diagnosis not present

## 2021-03-09 DIAGNOSIS — K8066 Calculus of gallbladder and bile duct with acute and chronic cholecystitis without obstruction: Secondary | ICD-10-CM | POA: Diagnosis not present

## 2021-03-09 DIAGNOSIS — K8000 Calculus of gallbladder with acute cholecystitis without obstruction: Secondary | ICD-10-CM | POA: Diagnosis not present

## 2021-03-09 DIAGNOSIS — K838 Other specified diseases of biliary tract: Secondary | ICD-10-CM | POA: Diagnosis not present

## 2021-03-09 DIAGNOSIS — Z20822 Contact with and (suspected) exposure to covid-19: Secondary | ICD-10-CM | POA: Diagnosis not present

## 2021-03-09 DIAGNOSIS — R748 Abnormal levels of other serum enzymes: Secondary | ICD-10-CM | POA: Diagnosis not present

## 2021-03-10 ENCOUNTER — Inpatient Hospital Stay (HOSPITAL_COMMUNITY): Payer: BC Managed Care – PPO | Admitting: Certified Registered Nurse Anesthetist

## 2021-03-10 ENCOUNTER — Inpatient Hospital Stay (HOSPITAL_COMMUNITY)
Admission: RE | Admit: 2021-03-10 | Discharge: 2021-03-13 | DRG: 419 | Disposition: A | Discharge status: Home / Self Care | Payer: BC Managed Care – PPO | Source: Ambulatory Visit | Attending: General Surgery | Admitting: General Surgery

## 2021-03-10 ENCOUNTER — Other Ambulatory Visit: Payer: Self-pay

## 2021-03-10 ENCOUNTER — Encounter (HOSPITAL_COMMUNITY): Admission: RE | Disposition: A | Discharge status: Home / Self Care | Payer: Self-pay | Source: Ambulatory Visit

## 2021-03-10 ENCOUNTER — Inpatient Hospital Stay (HOSPITAL_COMMUNITY): Payer: BC Managed Care – PPO

## 2021-03-10 ENCOUNTER — Encounter (HOSPITAL_COMMUNITY): Payer: Self-pay | Admitting: Surgery

## 2021-03-10 ENCOUNTER — Other Ambulatory Visit: Payer: Self-pay | Admitting: Surgery

## 2021-03-10 DIAGNOSIS — R932 Abnormal findings on diagnostic imaging of liver and biliary tract: Secondary | ICD-10-CM | POA: Diagnosis not present

## 2021-03-10 DIAGNOSIS — Z881 Allergy status to other antibiotic agents status: Secondary | ICD-10-CM

## 2021-03-10 DIAGNOSIS — I10 Essential (primary) hypertension: Secondary | ICD-10-CM | POA: Diagnosis present

## 2021-03-10 DIAGNOSIS — Z419 Encounter for procedure for purposes other than remedying health state, unspecified: Secondary | ICD-10-CM

## 2021-03-10 DIAGNOSIS — K5900 Constipation, unspecified: Secondary | ICD-10-CM | POA: Diagnosis present

## 2021-03-10 DIAGNOSIS — Z9049 Acquired absence of other specified parts of digestive tract: Secondary | ICD-10-CM | POA: Diagnosis not present

## 2021-03-10 DIAGNOSIS — F40240 Claustrophobia: Secondary | ICD-10-CM | POA: Diagnosis present

## 2021-03-10 DIAGNOSIS — Z79890 Hormone replacement therapy (postmenopausal): Secondary | ICD-10-CM

## 2021-03-10 DIAGNOSIS — Z90710 Acquired absence of both cervix and uterus: Secondary | ICD-10-CM

## 2021-03-10 DIAGNOSIS — K8062 Calculus of gallbladder and bile duct with acute cholecystitis without obstruction: Principal | ICD-10-CM | POA: Diagnosis present

## 2021-03-10 DIAGNOSIS — K819 Cholecystitis, unspecified: Secondary | ICD-10-CM | POA: Diagnosis present

## 2021-03-10 DIAGNOSIS — K8050 Calculus of bile duct without cholangitis or cholecystitis without obstruction: Secondary | ICD-10-CM | POA: Diagnosis not present

## 2021-03-10 DIAGNOSIS — K810 Acute cholecystitis: Secondary | ICD-10-CM | POA: Diagnosis not present

## 2021-03-10 DIAGNOSIS — Z79899 Other long term (current) drug therapy: Secondary | ICD-10-CM

## 2021-03-10 DIAGNOSIS — K7689 Other specified diseases of liver: Secondary | ICD-10-CM | POA: Diagnosis present

## 2021-03-10 DIAGNOSIS — Z20822 Contact with and (suspected) exposure to covid-19: Secondary | ICD-10-CM | POA: Diagnosis present

## 2021-03-10 DIAGNOSIS — K8020 Calculus of gallbladder without cholecystitis without obstruction: Secondary | ICD-10-CM | POA: Diagnosis not present

## 2021-03-10 HISTORY — PX: CHOLECYSTECTOMY: SHX55

## 2021-03-10 LAB — CBC WITH DIFFERENTIAL/PLATELET
Abs Immature Granulocytes: 0.01 10*3/uL (ref 0.00–0.07)
Basophils Absolute: 0.1 10*3/uL (ref 0.0–0.1)
Basophils Relative: 1 %
Eosinophils Absolute: 0.2 10*3/uL (ref 0.0–0.5)
Eosinophils Relative: 3 %
HCT: 36.6 % (ref 36.0–46.0)
Hemoglobin: 11.5 g/dL — ABNORMAL LOW (ref 12.0–15.0)
Immature Granulocytes: 0 %
Lymphocytes Relative: 34 %
Lymphs Abs: 2 10*3/uL (ref 0.7–4.0)
MCH: 27.4 pg (ref 26.0–34.0)
MCHC: 31.4 g/dL (ref 30.0–36.0)
MCV: 87.1 fL (ref 80.0–100.0)
Monocytes Absolute: 0.4 10*3/uL (ref 0.1–1.0)
Monocytes Relative: 6 %
Neutro Abs: 3.3 10*3/uL (ref 1.7–7.7)
Neutrophils Relative %: 56 %
Platelets: 331 10*3/uL (ref 150–400)
RBC: 4.2 MIL/uL (ref 3.87–5.11)
RDW: 12.8 % (ref 11.5–15.5)
WBC: 5.9 10*3/uL (ref 4.0–10.5)
nRBC: 0 % (ref 0.0–0.2)

## 2021-03-10 LAB — COMPREHENSIVE METABOLIC PANEL
ALT: 14 U/L (ref 0–44)
AST: 20 U/L (ref 15–41)
Albumin: 3.8 g/dL (ref 3.5–5.0)
Alkaline Phosphatase: 42 U/L (ref 38–126)
Anion gap: 7 (ref 5–15)
BUN: 7 mg/dL (ref 6–20)
CO2: 25 mmol/L (ref 22–32)
Calcium: 9.3 mg/dL (ref 8.9–10.3)
Chloride: 108 mmol/L (ref 98–111)
Creatinine, Ser: 0.89 mg/dL (ref 0.44–1.00)
GFR, Estimated: >60 mL/min (ref >60)
Glucose, Bld: 99 mg/dL (ref 70–99)
Potassium: 3.5 mmol/L (ref 3.5–5.1)
Sodium: 140 mmol/L (ref 135–145)
Total Bilirubin: 1.5 mg/dL — ABNORMAL HIGH (ref 0.3–1.2)
Total Protein: 7.4 g/dL (ref 6.5–8.1)

## 2021-03-10 LAB — SARS CORONAVIRUS 2 BY RT PCR (HOSPITAL ORDER, PERFORMED IN ~~LOC~~ HOSPITAL LAB): SARS Coronavirus 2: NEGATIVE

## 2021-03-10 SURGERY — LAPAROSCOPIC CHOLECYSTECTOMY WITH INTRAOPERATIVE CHOLANGIOGRAM
Anesthesia: General

## 2021-03-10 MED ORDER — ONDANSETRON HCL 4 MG/2ML IJ SOLN
INTRAMUSCULAR | Status: DC | PRN
  Administered 2021-03-10: 4 mg via INTRAVENOUS

## 2021-03-10 MED ORDER — LOSARTAN POTASSIUM 50 MG PO TABS
100.0000 mg | ORAL_TABLET | Freq: Every day | ORAL | Status: DC
  Administered 2021-03-11 – 2021-03-13 (×3): 100 mg via ORAL
  Filled 2021-03-10 (×5): qty 2

## 2021-03-10 MED ORDER — OXYCODONE HCL 5 MG PO TABS
5.0000 mg | ORAL_TABLET | ORAL | Status: DC | PRN
  Administered 2021-03-10 – 2021-03-13 (×4): 10 mg via ORAL
  Filled 2021-03-10 (×5): qty 2

## 2021-03-10 MED ORDER — PROMETHAZINE HCL 25 MG/ML IJ SOLN
6.2500 mg | INTRAMUSCULAR | Status: DC | PRN
Start: 2021-03-10 — End: 2021-03-10

## 2021-03-10 MED ORDER — DIPHENHYDRAMINE HCL 25 MG PO CAPS: 25.0000 mg | ORAL_CAPSULE | Freq: Four times a day (QID) | ORAL | Status: DC | PRN

## 2021-03-10 MED ORDER — OXYCODONE HCL 5 MG PO TABS
ORAL_TABLET | ORAL | Status: AC
  Filled 2021-03-10: qty 1

## 2021-03-10 MED ORDER — KETOROLAC TROMETHAMINE 30 MG/ML IJ SOLN
INTRAMUSCULAR | Status: AC
  Filled 2021-03-10: qty 1

## 2021-03-10 MED ORDER — MORPHINE SULFATE (PF) 2 MG/ML IV SOLN
2.0000 mg | INTRAVENOUS | Status: DC | PRN
  Administered 2021-03-10 – 2021-03-12 (×7): 2 mg via INTRAVENOUS
  Filled 2021-03-10 (×9): qty 1

## 2021-03-10 MED ORDER — MIDAZOLAM HCL 2 MG/2ML IJ SOLN
INTRAMUSCULAR | Status: DC | PRN
  Administered 2021-03-10: 2 mg via INTRAVENOUS

## 2021-03-10 MED ORDER — ACETAMINOPHEN 650 MG RE SUPP: 650.0000 mg | Freq: Four times a day (QID) | RECTAL | Status: DC | PRN

## 2021-03-10 MED ORDER — FENTANYL CITRATE (PF) 100 MCG/2ML IJ SOLN
25.0000 ug | INTRAMUSCULAR | Status: DC | PRN
  Administered 2021-03-10 (×3): 50 ug via INTRAVENOUS

## 2021-03-10 MED ORDER — FENTANYL CITRATE (PF) 250 MCG/5ML IJ SOLN
INTRAMUSCULAR | Status: DC | PRN
  Administered 2021-03-10 (×3): 50 ug via INTRAVENOUS
  Administered 2021-03-10: 100 ug via INTRAVENOUS

## 2021-03-10 MED ORDER — DOCUSATE SODIUM 100 MG PO CAPS
100.0000 mg | ORAL_CAPSULE | Freq: Two times a day (BID) | ORAL | Status: DC
  Administered 2021-03-10 – 2021-03-13 (×6): 100 mg via ORAL
  Filled 2021-03-10 (×7): qty 1

## 2021-03-10 MED ORDER — LIDOCAINE 2% (20 MG/ML) 5 ML SYRINGE
INTRAMUSCULAR | Status: AC
  Filled 2021-03-10: qty 5

## 2021-03-10 MED ORDER — 0.9 % SODIUM CHLORIDE (POUR BTL) OPTIME
TOPICAL | Status: DC | PRN
  Administered 2021-03-10: 1000 mL

## 2021-03-10 MED ORDER — BUPIVACAINE-EPINEPHRINE (PF) 0.25% -1:200000 IJ SOLN
INTRAMUSCULAR | Status: AC
  Filled 2021-03-10: qty 30

## 2021-03-10 MED ORDER — ROCURONIUM BROMIDE 10 MG/ML (PF) SYRINGE
PREFILLED_SYRINGE | INTRAVENOUS | Status: DC | PRN
  Administered 2021-03-10: 50 mg via INTRAVENOUS
  Administered 2021-03-10: 20 mg via INTRAVENOUS

## 2021-03-10 MED ORDER — POLYETHYLENE GLYCOL 3350 17 G PO PACK: 17.0000 g | PACK | Freq: Every day | ORAL | Status: DC | PRN

## 2021-03-10 MED ORDER — PANTOPRAZOLE SODIUM 40 MG IV SOLR
40.0000 mg | Freq: Every day | INTRAVENOUS | Status: DC
  Administered 2021-03-10 – 2021-03-12 (×3): 40 mg via INTRAVENOUS
  Filled 2021-03-10 (×3): qty 40

## 2021-03-10 MED ORDER — ONDANSETRON HCL 4 MG/2ML IJ SOLN: 4.0000 mg | Freq: Four times a day (QID) | INTRAMUSCULAR | Status: DC | PRN

## 2021-03-10 MED ORDER — LACTATED RINGERS IV SOLN: INTRAVENOUS | Status: DC

## 2021-03-10 MED ORDER — ENOXAPARIN SODIUM 40 MG/0.4ML IJ SOSY: 40.0000 mg | PREFILLED_SYRINGE | INTRAMUSCULAR | Status: DC

## 2021-03-10 MED ORDER — BISACODYL 10 MG RE SUPP: 10.0000 mg | Freq: Every day | RECTAL | Status: DC | PRN

## 2021-03-10 MED ORDER — ACETAMINOPHEN 500 MG PO TABS
ORAL_TABLET | ORAL | Status: AC
  Filled 2021-03-10: qty 1

## 2021-03-10 MED ORDER — HEMOSTATIC AGENTS (NO CHARGE) OPTIME
TOPICAL | Status: DC | PRN
  Administered 2021-03-10: 1 via TOPICAL

## 2021-03-10 MED ORDER — HYDRALAZINE HCL 20 MG/ML IJ SOLN: 10.0000 mg | Freq: Three times a day (TID) | INTRAMUSCULAR | Status: DC | PRN

## 2021-03-10 MED ORDER — SIMETHICONE 80 MG PO CHEW: 40.0000 mg | CHEWABLE_TABLET | Freq: Four times a day (QID) | ORAL | Status: DC | PRN

## 2021-03-10 MED ORDER — LIDOCAINE 2% (20 MG/ML) 5 ML SYRINGE
INTRAMUSCULAR | Status: DC | PRN
  Administered 2021-03-10: 70 mg via INTRAVENOUS

## 2021-03-10 MED ORDER — METHOCARBAMOL 500 MG PO TABS
500.0000 mg | ORAL_TABLET | Freq: Four times a day (QID) | ORAL | Status: DC | PRN
  Administered 2021-03-10: 500 mg via ORAL

## 2021-03-10 MED ORDER — DEXAMETHASONE SODIUM PHOSPHATE 10 MG/ML IJ SOLN
INTRAMUSCULAR | Status: DC | PRN
  Administered 2021-03-10: 5 mg via INTRAVENOUS

## 2021-03-10 MED ORDER — SODIUM CHLORIDE 0.9 % IR SOLN
Status: DC | PRN
  Administered 2021-03-10 (×2): 1000 mL

## 2021-03-10 MED ORDER — ROCURONIUM BROMIDE 10 MG/ML (PF) SYRINGE
PREFILLED_SYRINGE | INTRAVENOUS | Status: AC
  Filled 2021-03-10: qty 10

## 2021-03-10 MED ORDER — FENTANYL CITRATE (PF) 100 MCG/2ML IJ SOLN
INTRAMUSCULAR | Status: AC
  Filled 2021-03-10: qty 2

## 2021-03-10 MED ORDER — SODIUM CHLORIDE 0.9 % IV SOLN: INTRAVENOUS | Status: DC

## 2021-03-10 MED ORDER — METHOCARBAMOL 500 MG PO TABS
ORAL_TABLET | ORAL | Status: AC
  Filled 2021-03-10: qty 1

## 2021-03-10 MED ORDER — ACETAMINOPHEN 325 MG PO TABS: 650.0000 mg | ORAL_TABLET | Freq: Four times a day (QID) | ORAL | Status: DC | PRN

## 2021-03-10 MED ORDER — ORAL CARE MOUTH RINSE: 15.0000 mL | Freq: Once | OROMUCOSAL | Status: AC

## 2021-03-10 MED ORDER — ACETAMINOPHEN 10 MG/ML IV SOLN: 1000.0000 mg | Freq: Once | INTRAVENOUS | Status: DC | PRN

## 2021-03-10 MED ORDER — SODIUM CHLORIDE 0.9 % IV SOLN
INTRAVENOUS | Status: DC | PRN
  Administered 2021-03-10: 10 mL

## 2021-03-10 MED ORDER — ACETAMINOPHEN 500 MG PO TABS
1000.0000 mg | ORAL_TABLET | ORAL | Status: AC
  Administered 2021-03-10: 1000 mg via ORAL
  Filled 2021-03-10: qty 2

## 2021-03-10 MED ORDER — SUGAMMADEX SODIUM 200 MG/2ML IV SOLN
INTRAVENOUS | Status: DC | PRN
  Administered 2021-03-10: 200 mg via INTRAVENOUS

## 2021-03-10 MED ORDER — DEXMEDETOMIDINE (PRECEDEX) IN NS 20 MCG/5ML (4 MCG/ML) IV SYRINGE
PREFILLED_SYRINGE | INTRAVENOUS | Status: AC
  Filled 2021-03-10: qty 5

## 2021-03-10 MED ORDER — MIDAZOLAM HCL 2 MG/2ML IJ SOLN
INTRAMUSCULAR | Status: AC
  Filled 2021-03-10: qty 2

## 2021-03-10 MED ORDER — CEFAZOLIN SODIUM-DEXTROSE 2-4 GM/100ML-% IV SOLN
2.0000 g | INTRAVENOUS | Status: AC
  Administered 2021-03-10: 2 g via INTRAVENOUS
  Filled 2021-03-10: qty 100

## 2021-03-10 MED ORDER — FENTANYL CITRATE (PF) 250 MCG/5ML IJ SOLN
INTRAMUSCULAR | Status: AC
  Filled 2021-03-10: qty 5

## 2021-03-10 MED ORDER — DIPHENHYDRAMINE HCL 50 MG/ML IJ SOLN
INTRAMUSCULAR | Status: AC
  Filled 2021-03-10: qty 1

## 2021-03-10 MED ORDER — DEXAMETHASONE SODIUM PHOSPHATE 10 MG/ML IJ SOLN
INTRAMUSCULAR | Status: AC
  Filled 2021-03-10: qty 1

## 2021-03-10 MED ORDER — DIPHENHYDRAMINE HCL 50 MG/ML IJ SOLN: 25.0000 mg | Freq: Four times a day (QID) | INTRAMUSCULAR | Status: DC | PRN

## 2021-03-10 MED ORDER — ONDANSETRON HCL 4 MG/2ML IJ SOLN
INTRAMUSCULAR | Status: AC
  Filled 2021-03-10: qty 2

## 2021-03-10 MED ORDER — PROPOFOL 10 MG/ML IV BOLUS
INTRAVENOUS | Status: DC | PRN
  Administered 2021-03-10: 130 mg via INTRAVENOUS

## 2021-03-10 MED ORDER — CHLORHEXIDINE GLUCONATE 4 % EX LIQD: 1.0000 "application " | Freq: Once | CUTANEOUS | Status: DC

## 2021-03-10 MED ORDER — ZOLPIDEM TARTRATE 5 MG PO TABS: 5.0000 mg | ORAL_TABLET | Freq: Every evening | ORAL | Status: DC | PRN

## 2021-03-10 MED ORDER — DEXMEDETOMIDINE (PRECEDEX) IN NS 20 MCG/5ML (4 MCG/ML) IV SYRINGE
PREFILLED_SYRINGE | INTRAVENOUS | Status: DC | PRN
  Administered 2021-03-10: 8 ug via INTRAVENOUS
  Administered 2021-03-10: 4 ug via INTRAVENOUS
  Administered 2021-03-10: 8 ug via INTRAVENOUS

## 2021-03-10 MED ORDER — BUPIVACAINE-EPINEPHRINE 0.25% -1:200000 IJ SOLN
INTRAMUSCULAR | Status: DC | PRN
  Administered 2021-03-10: 10 mL

## 2021-03-10 MED ORDER — KETOROLAC TROMETHAMINE 30 MG/ML IJ SOLN
30.0000 mg | Freq: Once | INTRAMUSCULAR | Status: AC
  Administered 2021-03-10: 30 mg via INTRAVENOUS

## 2021-03-10 MED ORDER — CHLORHEXIDINE GLUCONATE 0.12 % MT SOLN
15.0000 mL | Freq: Once | OROMUCOSAL | Status: AC
  Administered 2021-03-10: 15 mL via OROMUCOSAL
  Filled 2021-03-10: qty 15

## 2021-03-10 MED ORDER — ONDANSETRON 4 MG PO TBDP: 4.0000 mg | ORAL_TABLET | Freq: Four times a day (QID) | ORAL | Status: DC | PRN

## 2021-03-10 SURGICAL SUPPLY — 51 items
ADH SKN CLS APL DERMABOND .7 (GAUZE/BANDAGES/DRESSINGS) ×1
APL SRG 38 LTWT LNG FL B (MISCELLANEOUS) ×1
APPLICATOR ARISTA FLEXITIP XL (MISCELLANEOUS) ×2 IMPLANT
APPLIER CLIP ROT 10 11.4 M/L (STAPLE) ×4
APR CLP MED LRG 11.4X10 (STAPLE) ×2
BAG SPEC RTRVL 10 TROC 200 (ENDOMECHANICALS) ×1
BLADE CLIPPER SURG (BLADE) IMPLANT
CANISTER SUCT 3000ML PPV (MISCELLANEOUS) ×2 IMPLANT
CHLORAPREP W/TINT 26 (MISCELLANEOUS) ×2 IMPLANT
CLIP APPLIE ROT 10 11.4 M/L (STAPLE) ×2 IMPLANT
CNTNR URN SCR LID CUP LEK RST (MISCELLANEOUS) ×1 IMPLANT
CONT SPEC 4OZ STRL OR WHT (MISCELLANEOUS) ×2
COVER MAYO STAND STRL (DRAPES) ×2 IMPLANT
COVER SURGICAL LIGHT HANDLE (MISCELLANEOUS) ×2 IMPLANT
COVER WAND RF STERILE (DRAPES) IMPLANT
DERMABOND ADVANCED (GAUZE/BANDAGES/DRESSINGS) ×1
DERMABOND ADVANCED .7 DNX12 (GAUZE/BANDAGES/DRESSINGS) ×1 IMPLANT
DRAPE C-ARM 42X120 X-RAY (DRAPES) ×2 IMPLANT
ELECT REM PT RETURN 9FT ADLT (ELECTROSURGICAL) ×2
ELECTRODE REM PT RTRN 9FT ADLT (ELECTROSURGICAL) ×1 IMPLANT
GLOVE BIO SURGEON STRL SZ8 (GLOVE) ×2 IMPLANT
GLOVE SRG 8 PF TXTR STRL LF DI (GLOVE) ×1 IMPLANT
GLOVE SURG UNDER POLY LF SZ7 (GLOVE) ×2 IMPLANT
GLOVE SURG UNDER POLY LF SZ8 (GLOVE) ×2
GOWN STRL REUS W/ TWL LRG LVL3 (GOWN DISPOSABLE) ×4 IMPLANT
GOWN STRL REUS W/ TWL XL LVL3 (GOWN DISPOSABLE) ×1 IMPLANT
GOWN STRL REUS W/TWL LRG LVL3 (GOWN DISPOSABLE) ×8
GOWN STRL REUS W/TWL XL LVL3 (GOWN DISPOSABLE) ×2
KIT BASIN OR (CUSTOM PROCEDURE TRAY) ×2 IMPLANT
KIT TURNOVER KIT B (KITS) ×2 IMPLANT
NS IRRIG 1000ML POUR BTL (IV SOLUTION) ×2 IMPLANT
PAD ARMBOARD 7.5X6 YLW CONV (MISCELLANEOUS) ×2 IMPLANT
POUCH RETRIEVAL ECOSAC 10 (ENDOMECHANICALS) ×1 IMPLANT
POUCH RETRIEVAL ECOSAC 10MM (ENDOMECHANICALS) ×2
SCISSORS LAP 5X35 DISP (ENDOMECHANICALS) ×2 IMPLANT
SET CHOLANGIOGRAPH 5 50 .035 (SET/KITS/TRAYS/PACK) IMPLANT
SET CHOLANGIOGRAPH MIX (MISCELLANEOUS) ×2 IMPLANT
SET IRRIG TUBING LAPAROSCOPIC (IRRIGATION / IRRIGATOR) ×2 IMPLANT
SET TUBE SMOKE EVAC HIGH FLOW (TUBING) ×2 IMPLANT
SLEEVE ENDOPATH XCEL 5M (ENDOMECHANICALS) ×2 IMPLANT
SPECIMEN JAR SMALL (MISCELLANEOUS) IMPLANT
SUT MNCRL AB 4-0 PS2 18 (SUTURE) ×2 IMPLANT
SUT VICRYL 0 UR6 27IN ABS (SUTURE) ×6 IMPLANT
TOWEL GREEN STERILE (TOWEL DISPOSABLE) ×2 IMPLANT
TOWEL GREEN STERILE FF (TOWEL DISPOSABLE) ×2 IMPLANT
TRAY LAPAROSCOPIC MC (CUSTOM PROCEDURE TRAY) ×2 IMPLANT
TROCAR XCEL BLUNT TIP 100MML (ENDOMECHANICALS) ×2 IMPLANT
TROCAR XCEL NON-BLD 11X100MML (ENDOMECHANICALS) ×2 IMPLANT
TROCAR XCEL NON-BLD 5MMX100MML (ENDOMECHANICALS) ×2 IMPLANT
WARMER LAPAROSCOPE (MISCELLANEOUS) ×2 IMPLANT
WATER STERILE IRR 1000ML POUR (IV SOLUTION) IMPLANT

## 2021-03-10 NOTE — Anesthesia Postprocedure Evaluation (Signed)
Anesthesia Post Note  Patient: Deanna Randolph  Procedure(s) Performed: LAPAROSCOPIC CHOLECYSTECTOMY WITH INTRAOPERATIVE CHOLANGIOGRAM (N/A )     Patient location during evaluation: PACU Anesthesia Type: General Level of consciousness: awake and alert Pain management: pain level controlled Vital Signs Assessment: post-procedure vital signs reviewed and stable Respiratory status: spontaneous breathing, nonlabored ventilation, respiratory function stable and patient connected to nasal cannula oxygen Cardiovascular status: blood pressure returned to baseline and stable Postop Assessment: no apparent nausea or vomiting Anesthetic complications: no   No complications documented.  Last Vitals:  Vitals:   03/10/21 1300 03/10/21 1314  BP:  113/67  Pulse: 65 69  Resp: 14 15  Temp:  36.6 C  SpO2: 94% 95%    Last Pain:  Vitals:   03/10/21 1314  TempSrc: Oral  PainSc:                  March Rummage Jeremie Giangrande

## 2021-03-10 NOTE — Plan of Care (Signed)

## 2021-03-10 NOTE — Consult Note (Signed)
Reason for Consult: ? Choledochlolithiasis Referring Physician: CCS  Willa Rough HPI: This is a 60 year old female with a PMH for hysterectomy admitted for an acute cholecystitis.  The patient has a history of intermittent abdominal pain over the past couple of years.  Of late she started to experience more pain and further evaluation by Dr. Collene Mares showed that she had cholelithiasis and a dilated CBD at 9.6 mm.  Her liver enzymes were normal and further imaging with an MRI was not possible as a result of her claustrophobia.  She was evaluated by Dr. Ninfa Linden and a CT scan of the abdomen was obtained and she was felt to have an acute cholecystitis.  The CBD diameter also increased to 1.2 cm as well as intrahepatic biliary ductal dilation, but her liver enzymes remain normal.  Incidental hepatic cysts were also found.  The lap chole was performed today and the IOC shows the possibility of a CBD stone.  As a result of the findings a GI consultation was requested.  Past Medical History:  Diagnosis Date  . Hypertension     Past Surgical History:  Procedure Laterality Date  . ABDOMINAL HYSTERECTOMY  06/03/2012   Procedure: HYSTERECTOMY ABDOMINAL;  Surgeon: Cyril Mourning, MD;  Location: [facility];  Service: Gynecology;  Laterality: Bilateral;  . DILATION AND CURETTAGE OF UTERUS    . DILATION AND CURETTAGE OF UTERUS  04/17/2012   Procedure: DILATATION AND CURETTAGE;  Surgeon: Cyril Mourning, MD;  Location: [facility];  Service: Gynecology;  Laterality: N/A;  . SALPINGOOPHORECTOMY  06/03/2012   Procedure: SALPINGO OOPHERECTOMY;  Surgeon: Cyril Mourning, MD;  Location: [facility];  Service: Gynecology;  Laterality: Bilateral;    Family History  Problem Relation Age of Onset  . Cancer Mother     Social History:  reports that she has never smoked. She has never used smokeless tobacco. She reports current alcohol use. She reports that she does not use drugs.  Allergies:  Allergies  Allergen Reactions   . Cephalosporins Diarrhea    Gi upset  . Cipro [Ciprofloxacin Hcl] Diarrhea    Gi upset    Medications:  Scheduled: . acetaminophen      . docusate sodium  100 mg Oral BID  . [START ON 03/11/2021] enoxaparin (LOVENOX) injection  40 mg Subcutaneous Q24H  . fentaNYL      . fentaNYL      . ketorolac      . losartan  100 mg Oral Daily  . methocarbamol      . pantoprazole (PROTONIX) IV  40 mg Intravenous QHS   Continuous: . sodium chloride 75 mL/hr at 03/10/21 1330    Results for orders placed or performed during the hospital encounter of 03/10/21 (from the past 24 hour(s))  SARS Coronavirus 2 by RT PCR (hospital order, performed in [place] hospital lab) Nasopharyngeal Nasopharyngeal Swab     Status: None   Collection Time: 03/10/21  8:23 AM   Specimen: Nasopharyngeal Swab  Result Value Ref Range   SARS Coronavirus 2 NEGATIVE NEGATIVE  CBC WITH DIFFERENTIAL     Status: Abnormal   Collection Time: 03/10/21  8:25 AM  Result Value Ref Range   WBC 5.9 4.0 - 10.5 K/uL   RBC 4.20 3.87 - 5.11 MIL/uL   Hemoglobin 11.5 (L) 12.0 - 15.0 g/dL   HCT 36.6 36.0 - 46.0 %   MCV 87.1 80.0 - 100.0 fL   MCH 27.4 26.0 - 34.0 pg   MCHC  31.4 30.0 - 36.0 g/dL   RDW 12.8 11.5 - 15.5 %   Platelets 331 150 - 400 K/uL   nRBC 0.0 0.0 - 0.2 %   Neutrophils Relative % 56 %   Neutro Abs 3.3 1.7 - 7.7 K/uL   Lymphocytes Relative 34 %   Lymphs Abs 2.0 0.7 - 4.0 K/uL   Monocytes Relative 6 %   Monocytes Absolute 0.4 0.1 - 1.0 K/uL   Eosinophils Relative 3 %   Eosinophils Absolute 0.2 0.0 - 0.5 K/uL   Basophils Relative 1 %   Basophils Absolute 0.1 0.0 - 0.1 K/uL   Immature Granulocytes 0 %   Abs Immature Granulocytes 0.01 0.00 - 0.07 K/uL  Comprehensive metabolic panel     Status: Abnormal   Collection Time: 03/10/21  8:25 AM  Result Value Ref Range   Sodium 140 135 - 145 mmol/L   Potassium 3.5 3.5 - 5.1 mmol/L   Chloride 108 98 - 111 mmol/L   CO2 25 22 - 32 mmol/L   Glucose, Bld 99 70 - 99  mg/dL   BUN 7 6 - 20 mg/dL   Creatinine, Ser 0.89 0.44 - 1.00 mg/dL   Calcium 9.3 8.9 - 10.3 mg/dL   Total Protein 7.4 6.5 - 8.1 g/dL   Albumin 3.8 3.5 - 5.0 g/dL   AST 20 15 - 41 U/L   ALT 14 0 - 44 U/L   Alkaline Phosphatase 42 38 - 126 U/L   Total Bilirubin 1.5 (H) 0.3 - 1.2 mg/dL   GFR, Estimated >60 >60 mL/min   Anion gap 7 5 - 15     CT ABDOMEN PELVIS WO CONTRAST  Result Date: 03/09/2021 CLINICAL DATA:  Left lower abdominal pain over the last few days. Cholelithiasis shown on recent ultrasound. EXAM: CT ABDOMEN AND PELVIS WITHOUT CONTRAST TECHNIQUE: Multidetector CT imaging of the abdomen and pelvis was performed following the standard protocol without IV contrast. COMPARISON:  Abdominal ultrasound 03/02/2021 FINDINGS: Lower chest: Linear subsegmental atelectasis or scarring in the right lower lobe adjacent to the hemidiaphragm. Hepatobiliary: Mildly distended gallbladder with suspected mild gallbladder wall thickening, sludge, and multiple gallstones measuring up to about 1.7 cm in diameter. Four small fluid density lesions of the liver, the largest measuring 1.9 by 1.7 cm on image 14 of series 2, likely cysts. Strictly speaking the smaller lesions are technically too small to characterize. Dilated common bile duct measuring up to 1.2 cm in diameter. Pancreas: Unremarkable Spleen: Unremarkable Adrenals/Urinary Tract: Unremarkable Stomach/Bowel: Unremarkable.  Normal appendix. Vascular/Lymphatic: Unremarkable Reproductive: Uterus absent.  Adnexa unremarkable. Other: No supplemental non-categorized findings. Musculoskeletal: Mild facet arthropathy of the lumbosacral junction. IMPRESSION: 1. Distended and thick-walled gallbladder with sludge in gallstones as well as CBD dilatation. Cannot exclude cholecystitis. Although there is no directly visualized choledocholithiasis, the CBD dilatation raises the possibility of a distal obstructive process. Further workup which might be considered may  include nuclear medicine patent biliary scan to assess patency of the biliary tree and cystic duct; and/or MRCP examination. 2. Subsegmental atelectasis in the right lower lobe. 3. Hepatic cysts. Electronically Signed   By: Van Clines M.D.   On: 03/09/2021 17:12   DG Cholangiogram Operative  Result Date: 03/10/2021 CLINICAL DATA:  60 year old female undergoing laparoscopic cholecystectomy in the setting of cholelithiasis. EXAM: INTRAOPERATIVE CHOLANGIOGRAM TECHNIQUE: Cholangiographic imagesfrom the C-arm fluoroscopic device wassubmitted for interpretation post-operatively. Please see the procedural report for the amount of contrast and the fluoroscopy time utilized. COMPARISON:  03/09/2021 FINDINGS: Intraoperative antegrade  injection via the cystic duct which opacifies the common bile duct and central portions of the intrahepatic biliary tree. Gallbladder is removed. Contrast is present within the duodenum as well as the right upper quadrant colon. Possible ovoid filling defect in the mid to distal common bile duct, difficult to discern on single image. This finding may represent overlying bowel gas or other artifactual abnormality. Similar appearing moderate extrahepatic and central intrahepatic biliary ductal dilation. No apparent anomalous anatomical configuration of the biliary tree. IMPRESSION: 1. Possible ovoid filling defect in the mid to distal common bile duct, limited exam due to single acquired image. This finding could represent choledocholithiasis, injected gas, or superimposed overlying bowel gas. ERCP could be considered in the appropriate clinical setting. 2. Moderate intra and extrahepatic biliary ductal dilation, similar to comparison CT. Gradual tapering of the distal common bile duct with no definite obstruction. Ruthann Cancer, MD [facility] Electronically Signed   By: Ruthann Cancer MD   On: 03/10/2021 11:27    ROS:  As  stated above in the HPI otherwise negative.  Blood pressure 113/67, pulse 69, temperature 97.8 F (36.6 C), temperature source Oral, resp. rate 15, height 5\' 4"  (1.626 m), weight 76.2 kg, SpO2 95 %.    PE: Gen: NAD, Alert and Oriented HEENT:  [place]/AT, EOMI Neck: Supple, no LAD Lungs: CTA Bilaterally CV: RRR without M/G/R ABD: Soft, tender at the incision sites, +BS Ext: No C/C/E  Assessment/Plan: 1) ? Choledocholithiasis. 2) S/p lap chole.   The current finding were reviewed and discussed with the patient and her husband, a physician.  There is the possibility of a retained stone and there is evidence of an increase in her CBD as well as intrahepatics, however, her liver enzymes continue to be normal.  There is a smooth tapering in the distal CBD, but there was no mention of stricture/obstruction with the IOC.  Further evaluation with an EUS +/- ERCP will be pursued.  This will allow imaging of the CBD without inducing claustrophobia.  If no stones are detected she may still benefit from an ERCP as she may have some type of stricture or papillary stenosis.  The risk of pancreatitis with the ERCP was discussed with the patient and her husband.  Barring any complications she should not feel worse.  She does notice an improvement after the lap chole, but it is difficult for her to discern if there is any biliary pain versus the pain from the surgery.  Plan: 1) EUS +/- ERCP. 2) Hold Lovenox. Mykeria Garman D 03/10/2021, 2:41 PM

## 2021-03-10 NOTE — H&P (View-Only) (Signed)
Reason for Consult: ? Choledochlolithiasis Referring Physician: CCS  Shailee Jay HPI: This is a 59 year old female with a PMH for hysterectomy admitted for an acute cholecystitis.  The patient has a history of intermittent abdominal pain over the past couple of years.  Of late she started to experience more pain and further evaluation by Dr. Mann showed that she had cholelithiasis and a dilated CBD at 9.6 mm.  Her liver enzymes were normal and further imaging with an MRI was not possible as a result of her claustrophobia.  She was evaluated by Dr. Blackman and a CT scan of the abdomen was obtained and she was felt to have an acute cholecystitis.  The CBD diameter also increased to 1.2 cm as well as intrahepatic biliary ductal dilation, but her liver enzymes remain normal.  Incidental hepatic cysts were also found.  The lap chole was performed today and the IOC shows the possibility of a CBD stone.  As a result of the findings a GI consultation was requested.  Past Medical History:  Diagnosis Date  . Hypertension     Past Surgical History:  Procedure Laterality Date  . ABDOMINAL HYSTERECTOMY  06/03/2012   Procedure: HYSTERECTOMY ABDOMINAL;  Surgeon: Michelle L Grewal, MD;  Location: [facility];  Service: Gynecology;  Laterality: Bilateral;  . DILATION AND CURETTAGE OF UTERUS    . DILATION AND CURETTAGE OF UTERUS  04/17/2012   Procedure: DILATATION AND CURETTAGE;  Surgeon: Michelle L Grewal, MD;  Location: [facility];  Service: Gynecology;  Laterality: N/A;  . SALPINGOOPHORECTOMY  06/03/2012   Procedure: SALPINGO OOPHERECTOMY;  Surgeon: Michelle L Grewal, MD;  Location: [facility];  Service: Gynecology;  Laterality: Bilateral;    Family History  Problem Relation Age of Onset  . Cancer Mother     Social History:  reports that she has never smoked. She has never used smokeless tobacco. She reports current alcohol use. She reports that she does not use drugs.  Allergies:  Allergies  Allergen Reactions   . Cephalosporins Diarrhea    Gi upset  . Cipro [Ciprofloxacin Hcl] Diarrhea    Gi upset    Medications:  Scheduled: . acetaminophen      . docusate sodium  100 mg Oral BID  . [START ON 03/11/2021] enoxaparin (LOVENOX) injection  40 mg Subcutaneous Q24H  . fentaNYL      . fentaNYL      . ketorolac      . losartan  100 mg Oral Daily  . methocarbamol      . pantoprazole (PROTONIX) IV  40 mg Intravenous QHS   Continuous: . sodium chloride 75 mL/hr at 03/10/21 1330    Results for orders placed or performed during the hospital encounter of 03/10/21 (from the past 24 hour(s))  SARS Coronavirus 2 by RT PCR (hospital order, performed in [place] hospital lab) Nasopharyngeal Nasopharyngeal Swab     Status: None   Collection Time: 03/10/21  8:23 AM   Specimen: Nasopharyngeal Swab  Result Value Ref Range   SARS Coronavirus 2 NEGATIVE NEGATIVE  CBC WITH DIFFERENTIAL     Status: Abnormal   Collection Time: 03/10/21  8:25 AM  Result Value Ref Range   WBC 5.9 4.0 - 10.5 K/uL   RBC 4.20 3.87 - 5.11 MIL/uL   Hemoglobin 11.5 (L) 12.0 - 15.0 g/dL   HCT 36.6 36.0 - 46.0 %   MCV 87.1 80.0 - 100.0 fL   MCH 27.4 26.0 - 34.0 pg   MCHC   31.4 30.0 - 36.0 g/dL   RDW 12.8 11.5 - 15.5 %   Platelets 331 150 - 400 K/uL   nRBC 0.0 0.0 - 0.2 %   Neutrophils Relative % 56 %   Neutro Abs 3.3 1.7 - 7.7 K/uL   Lymphocytes Relative 34 %   Lymphs Abs 2.0 0.7 - 4.0 K/uL   Monocytes Relative 6 %   Monocytes Absolute 0.4 0.1 - 1.0 K/uL   Eosinophils Relative 3 %   Eosinophils Absolute 0.2 0.0 - 0.5 K/uL   Basophils Relative 1 %   Basophils Absolute 0.1 0.0 - 0.1 K/uL   Immature Granulocytes 0 %   Abs Immature Granulocytes 0.01 0.00 - 0.07 K/uL  Comprehensive metabolic panel     Status: Abnormal   Collection Time: 03/10/21  8:25 AM  Result Value Ref Range   Sodium 140 135 - 145 mmol/L   Potassium 3.5 3.5 - 5.1 mmol/L   Chloride 108 98 - 111 mmol/L   CO2 25 22 - 32 mmol/L   Glucose, Bld 99 70 - 99  mg/dL   BUN 7 6 - 20 mg/dL   Creatinine, Ser 0.89 0.44 - 1.00 mg/dL   Calcium 9.3 8.9 - 10.3 mg/dL   Total Protein 7.4 6.5 - 8.1 g/dL   Albumin 3.8 3.5 - 5.0 g/dL   AST 20 15 - 41 U/L   ALT 14 0 - 44 U/L   Alkaline Phosphatase 42 38 - 126 U/L   Total Bilirubin 1.5 (H) 0.3 - 1.2 mg/dL   GFR, Estimated >60 >60 mL/min   Anion gap 7 5 - 15     CT ABDOMEN PELVIS WO CONTRAST  Result Date: 03/09/2021 CLINICAL DATA:  Left lower abdominal pain over the last few days. Cholelithiasis shown on recent ultrasound. EXAM: CT ABDOMEN AND PELVIS WITHOUT CONTRAST TECHNIQUE: Multidetector CT imaging of the abdomen and pelvis was performed following the standard protocol without IV contrast. COMPARISON:  Abdominal ultrasound 03/02/2021 FINDINGS: Lower chest: Linear subsegmental atelectasis or scarring in the right lower lobe adjacent to the hemidiaphragm. Hepatobiliary: Mildly distended gallbladder with suspected mild gallbladder wall thickening, sludge, and multiple gallstones measuring up to about 1.7 cm in diameter. Four small fluid density lesions of the liver, the largest measuring 1.9 by 1.7 cm on image 14 of series 2, likely cysts. Strictly speaking the smaller lesions are technically too small to characterize. Dilated common bile duct measuring up to 1.2 cm in diameter. Pancreas: Unremarkable Spleen: Unremarkable Adrenals/Urinary Tract: Unremarkable Stomach/Bowel: Unremarkable.  Normal appendix. Vascular/Lymphatic: Unremarkable Reproductive: Uterus absent.  Adnexa unremarkable. Other: No supplemental non-categorized findings. Musculoskeletal: Mild facet arthropathy of the lumbosacral junction. IMPRESSION: 1. Distended and thick-walled gallbladder with sludge in gallstones as well as CBD dilatation. Cannot exclude cholecystitis. Although there is no directly visualized choledocholithiasis, the CBD dilatation raises the possibility of a distal obstructive process. Further workup which might be considered may  include nuclear medicine patent biliary scan to assess patency of the biliary tree and cystic duct; and/or MRCP examination. 2. Subsegmental atelectasis in the right lower lobe. 3. Hepatic cysts. Electronically Signed   By: Walter  Liebkemann M.D.   On: 03/09/2021 17:12   DG Cholangiogram Operative  Result Date: 03/10/2021 CLINICAL DATA:  59-year-old female undergoing laparoscopic cholecystectomy in the setting of cholelithiasis. EXAM: INTRAOPERATIVE CHOLANGIOGRAM TECHNIQUE: Cholangiographic imagesfrom the C-arm fluoroscopic device wassubmitted for interpretation post-operatively. Please see the procedural report for the amount of contrast and the fluoroscopy time utilized. COMPARISON:  03/09/2021 FINDINGS: Intraoperative antegrade   injection via the cystic duct which opacifies the common bile duct and central portions of the intrahepatic biliary tree. Gallbladder is removed. Contrast is present within the duodenum as well as the right upper quadrant colon. Possible ovoid filling defect in the mid to distal common bile duct, difficult to discern on single image. This finding may represent overlying bowel gas or other artifactual abnormality. Similar appearing moderate extrahepatic and central intrahepatic biliary ductal dilation. No apparent anomalous anatomical configuration of the biliary tree. IMPRESSION: 1. Possible ovoid filling defect in the mid to distal common bile duct, limited exam due to single acquired image. This finding could represent choledocholithiasis, injected gas, or superimposed overlying bowel gas. ERCP could be considered in the appropriate clinical setting. 2. Moderate intra and extrahepatic biliary ductal dilation, similar to comparison CT. Gradual tapering of the distal common bile duct with no definite obstruction. Ruthann Cancer, MD [facility] Electronically Signed   By: Ruthann Cancer MD   On: 03/10/2021 11:27    ROS:  As  stated above in the HPI otherwise negative.  Blood pressure 113/67, pulse 69, temperature 97.8 F (36.6 C), temperature source Oral, resp. rate 15, height 5\' 4"  (1.626 m), weight 76.2 kg, SpO2 95 %.    PE: Gen: NAD, Alert and Oriented HEENT:  [place]/AT, EOMI Neck: Supple, no LAD Lungs: CTA Bilaterally CV: RRR without M/G/R ABD: Soft, tender at the incision sites, +BS Ext: No C/C/E  Assessment/Plan: 1) ? Choledocholithiasis. 2) S/p lap chole.   The current finding were reviewed and discussed with the patient and her husband, a physician.  There is the possibility of a retained stone and there is evidence of an increase in her CBD as well as intrahepatics, however, her liver enzymes continue to be normal.  There is a smooth tapering in the distal CBD, but there was no mention of stricture/obstruction with the IOC.  Further evaluation with an EUS +/- ERCP will be pursued.  This will allow imaging of the CBD without inducing claustrophobia.  If no stones are detected she may still benefit from an ERCP as she may have some type of stricture or papillary stenosis.  The risk of pancreatitis with the ERCP was discussed with the patient and her husband.  Barring any complications she should not feel worse.  She does notice an improvement after the lap chole, but it is difficult for her to discern if there is any biliary pain versus the pain from the surgery.  Plan: 1) EUS +/- ERCP. 2) Hold Lovenox. Sylvia Helms D 03/10/2021, 2:41 PM

## 2021-03-10 NOTE — Anesthesia Preprocedure Evaluation (Signed)
Anesthesia Evaluation  Patient identified by MRN, date of birth, ID band Patient awake    Reviewed: Allergy & Precautions, NPO status , Patient's Chart, lab work & pertinent test results  Airway Mallampati: I  TM Distance: >3 FB Neck ROM: Full    Dental  (+) Teeth Intact   Pulmonary neg pulmonary ROS,    Pulmonary exam normal        Cardiovascular hypertension, Pt. on medications  Rhythm:Regular Rate:Normal     Neuro/Psych negative neurological ROS  negative psych ROS   GI/Hepatic Neg liver ROS, Gallstones, cholecystitis    Endo/Other  negative endocrine ROS  Renal/GU negative Renal ROS  negative genitourinary   Musculoskeletal negative musculoskeletal ROS (+)   Abdominal (+)  Abdomen: soft. Bowel sounds: normal.  Peds  Hematology negative hematology ROS (+)   Anesthesia Other Findings   Reproductive/Obstetrics                             Anesthesia Physical Anesthesia Plan  ASA: II  Anesthesia Plan: General   Post-op Pain Management:    Induction: Intravenous  PONV Risk Score and Plan: 3 and Ondansetron, Dexamethasone, Midazolam and Treatment may vary due to age or medical condition  Airway Management Planned: Mask and Oral ETT  Additional Equipment: None  Intra-op Plan:   Post-operative Plan: Extubation in OR  Informed Consent: I have reviewed the patients History and Physical, chart, labs and discussed the procedure including the risks, benefits and alternatives for the proposed anesthesia with the patient or authorized representative who has indicated his/her understanding and acceptance.     Dental advisory given  Plan Discussed with: CRNA  Anesthesia Plan Comments: (Lab Results      Component                Value               Date                      WBC                      5.9                 03/10/2021                HGB                      11.5 (L)             03/10/2021                HCT                      36.6                03/10/2021                MCV                      87.1                03/10/2021                PLT                      331  03/10/2021           Lab Results      Component                Value               Date                      NA                       144                 01/22/2015                K                        4.7                 01/22/2015                CO2                      29                  01/22/2015                GLUCOSE                  89                  01/22/2015                BUN                      10                  01/22/2015                CREATININE               0.64                01/22/2015                CALCIUM                  10.2                01/22/2015                GFRNONAA                 >89                 01/22/2015                GFRAA                    >89                 01/22/2015          )        Anesthesia Quick Evaluation

## 2021-03-10 NOTE — Interval H&P Note (Signed)
History and Physical Interval Note:  03/10/2021 9:25 AM  Deanna Randolph  has presented today for surgery, with the diagnosis of gallstones and cholecystitis.  The various methods of treatment have been discussed with the patient and family. After consideration of risks, benefits and other options for treatment, the patient has consented to  Procedure(s): LAPAROSCOPIC CHOLECYSTECTOMY WITH INTRAOPERATIVE CHOLANGIOGRAM (N/A) as a surgical intervention.  The patient's history has been reviewed, patient examined, no change in status, stable for surgery.  I have reviewed the patient's chart and labs.  Questions were answered to the patient's satisfaction.      Patient seen, examined and agree..  Dr. Trevor Mace note.  CT scan reviewed which shows severe chronic cholecystitis with large gallstone.  She also has common bile duct dilation.  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  Long discussion with patient husband at bedside this morning.  Went over all options to include partial cholecystectomy, open cholecystectomy, the need for drains, need for ERCP and stent, and the need for possible revisional surgery and/or resectional surgery depending on the gallbladder is adhesiveness to neighboring structures to include the colon small bowel as well as duodenum and stomach.  This process has been going on for quite a time and the patient's had symptoms off and on for 5 years.  I discussed the pros and cons as well as percutaneous drainage which is a short-term solution but not a long-term solution for her.  After lengthy discussion the plan will be laparoscopic cholecystectomy with understanding that she may need a partial cholecystectomy, possible cholangiogram, possible open surgery, and additional surgery depending on intraoperative findings.  Long-term expectation, recovery, complication rates discussed.  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 Operative and non operative treatments have been discussed. Risks of surgery include bleeding, infection,  Common bile duct injury,  Injury to the stomach,liver, colon,small intestine, abdominal wall,  Diaphragm,  Major blood vessels,  And the need for an open procedure.  Other risks include worsening of medical problems, death,  DVT and pulmonary embolism, and cardiovascular events.   Medical options have also been discussed. The patient has been informed of long term expectations of surgery and non surgical options,  The patient agrees to proceed.      Turner Daniels  MD

## 2021-03-10 NOTE — H&P (Signed)
Deanna Randolph Appointment: 03/08/2021 11:50 AM Location: [facility] Patient #: [id number] DOB: Oct 30, 1960 Married / Language: English / Race: Asian Female   History of Present Illness (Osei Anger A. Ninfa Linden MD; 03/08/2021 12:15 PM) The patient is a 60 year old female who presents with abdominal pain.  Chief complaint, acute abdominal pain  This is a 60 year old female whose husband is a physician who is referred here for acute abdominal pain. Back in March while in traveling in [place] she had 2 days' worth of severe epigastric abdominal pain. She does not have nausea or vomiting. It subsided. This did occur after a fatty meal. On May 8, she had recurrent severe abdominal pain which lasted multiple hours after fatty meal. She had nausea and bloating but no emesis. She denied jaundice. She had an ultrasound showing a distended gallbladder with sludge and stones with the largest I measured 1.8 cm. The bile duct was dilated to 9.6 mm. She had labs done by her gastroenterologist which showed normal liver function tests. Since that time, she has been having worsening epigastric abdominal pain as well as left lower quadrant abdominal pain. She is also constipation. She has not had a prior colonoscopy.   Past Surgical History Emeline Gins, [place]; 03/08/2021 12:01 PM) Hysterectomy (not due to cancer) - Complete  Hysterectomy (not due to cancer) - Partial   Allergies Janeann Forehand, CNA; 03/08/2021 11:42 AM) No Known Drug Allergies  [03/08/2021]: Allergies Reconciled   Medication History Janeann Forehand, CNA; 03/08/2021 11:45 AM) Cholecalciferol (100 MCG(4000 UT) Tablet, Oral) Active. COVID-19 mRNA Vacc (Moderna) (100MCG/0.5ML Suspension, Intramuscular) Active. Cyclobenzaprine HCl (7.5MG  Tablet, Oral) Active. Diclofenac (50MG  Tablet, Oral) Active. hydroCHLOROthiazide Active. Hydrocortisone Acetate (0.5% Aerosol, External) Active. Losartan Potassium (25MG  Tablet,  Oral) Active. Minivelle (0.025MG /24HR Patch TW, Transdermal) Active. Multiple Vitamin (Oral) Active. SUMAtriptan (5MG /ACT Solution, Nasal) Active. Zolpidem & Diet Manage Prod (5MG  Misc, Oral) Active. Medications Reconciled  Social History Emeline Gins, [place]; 03/08/2021 12:01 PM) Alcohol use  Occasional alcohol use. Caffeine use  Tea. Tobacco use  Never smoker.  Family History Emeline Gins, [place]; 03/08/2021 12:01 PM) Cancer  Mother.    Review of Systems Emeline Gins CMA; 03/08/2021 12:01 PM) Gastrointestinal Not Present- Abdominal Pain, Bloating, Bloody Stool, Change in Bowel Habits, Chronic diarrhea, Constipation, Difficulty Swallowing, Excessive gas, Gets full quickly at meals, Hemorrhoids, Indigestion, Nausea, Rectal Pain and Vomiting. Musculoskeletal Not Present- Back Pain, Joint Pain, Joint Stiffness, Muscle Pain, Muscle Weakness and Swelling of Extremities.  Vitals Adriana Reams [place] CNA; 03/08/2021 11:46 AM) 03/08/2021 11:45 AM Weight: 168.5 lb Height: 63in Body Surface Area: 1.8 m Body Mass Index: 29.85 kg/m  Temp.: 82F  Pulse: 79 (Regular)  P.OX: 99% (Room air) BP: 136/90(Sitting, Left Arm, Standard)       Physical Exam (Orlo Brickle A. Ninfa Linden MD; 03/08/2021 12:16 PM) The physical exam findings are as follows: Note: On exam today, she appears uncomfortable  There is no jaundice  Her abdomen is soft but there is significant tenderness with guarding in the epigastrium as well as the left lower quadrant and right upper quadrant. There are no hernias and there is no enlargement of liver.  Lungs clear CV RRR Skin without rash  Assessment & Plan  Cholecystitis with cholelithiasis   have reviewed her ultrasound and previous laboratory data. She does have a significantly dilated bile duct and large gallstones. Her physical examination today given the tenderness on examination is very worrisome. I'm uncertain why she is so tender in the left  lower quadrant as well. I  believe she needs a stat CT scan of her abdomen and pelvis to evaluate for diverticulitis or pancreatitis or even some underlying malignancy based on her symptoms as well as the dilated bile duct. I discussed this with her in detail I have also discussed this with her husband who is a physician as well. The CAT scan will be ordered and I will call him back as soon as possible with results. We will then determine whether or not she requires admission to the hospital.  Addendum: The CT scan of the abdomen and pelvis shows a distended gallbladder with multiple gallstones as well as multiple small liver cysts.  The bile duct is dilated to 1.2 cm.  Given these findings and her physical exam, urgent cholecystectomy is recommended with cholangiogram.  I have discussed this with her in detail.  She is being admitted to our acute care surgical service with plans for Dr. Erroll Luna to perform her cholecystectomy when time is available today.  I again discussed the risks of the surgery with her which includes but is not limited to bleeding, infection, injury to surrounding structures, bile duct injury, bile leak, the need to convert to an open procedure, the need for drains, the need for further procedures, cardiopulmonary issues, DVT, etc.

## 2021-03-10 NOTE — Discharge Instructions (Addendum)
[facility]  Please arrive at least 30 min before your appointment to complete your check in paperwork.  If you are unable to arrive 30 min prior to your appointment time we may have to cancel or reschedule you. LAPAROSCOPIC SURGERY: POST OP INSTRUCTIONS Always review your discharge instruction sheet given to you by the facility where your surgery was performed. IF YOU HAVE DISABILITY OR FAMILY LEAVE FORMS, YOU MUST BRING THEM TO THE OFFICE FOR PROCESSING.   DO NOT GIVE THEM TO YOUR DOCTOR.  PAIN CONTROL  1. First take acetaminophen (Tylenol) AND/or ibuprofen (Advil) to control your pain after surgery.  Follow directions on package.  Taking acetaminophen (Tylenol) and/or ibuprofen (Advil) regularly after surgery will help to control your pain and lower the amount of prescription pain medication you may need.  You should not take more than 4,000 mg (4 grams) of acetaminophen (Tylenol) in 24 hours.  You should not take ibuprofen (Advil), aleve, motrin, naprosyn or other NSAIDS if you have a history of stomach ulcers or chronic kidney disease.  2. A prescription for pain medication may be given to you upon discharge.  Take your pain medication as prescribed, if you still have uncontrolled pain after taking acetaminophen (Tylenol) or ibuprofen (Advil). 3. Use ice packs to help control pain. 4. If you need a refill on your pain medication, please contact your pharmacy.  They will contact our office to request authorization. Prescriptions will not be filled after 5pm or on week-ends.  HOME MEDICATIONS 5. Take your usually prescribed medications unless otherwise directed.  DIET 6. You should follow a light diet the first few days after arrival home.  Be sure to include lots of fluids daily. Avoid fatty, fried foods.   CONSTIPATION 7. It is common to experience some constipation after surgery and if you are taking pain medication.  Increasing fluid intake and taking a stool  softener (such as Colace) will usually help or prevent this problem from occurring.  A mild laxative (Milk of Magnesia or Miralax) should be taken according to package instructions if there are no bowel movements after 48 hours.  WOUND/INCISION CARE 8. Most patients will experience some swelling and bruising in the area of the incisions.  Ice packs will help.  Swelling and bruising can take several days to resolve.  9. Unless discharge instructions indicate otherwise, follow guidelines below  a. STERI-STRIPS - you may remove your outer bandages 48 hours after surgery, and you may shower at that time.  You have steri-strips (small skin tapes) in place directly over the incision.  These strips should be left on the skin for 7-10 days.   b. DERMABOND/SKIN GLUE - you may shower in 24 hours.  The glue will flake off over the next 2-3 weeks. 10. Any sutures or staples will be removed at the office during your follow-up visit.  ACTIVITIES 11. You may resume regular (light) daily activities beginning the next day--such as daily self-care, walking, climbing stairs--gradually increasing activities as tolerated.  You may have sexual intercourse when it is comfortable.  Refrain from any heavy lifting or straining until approved by your doctor. a. You may drive when you are no longer taking prescription pain medication, you can comfortably wear a seatbelt, and you can safely maneuver your car and apply brakes.  This usually takes about 2 weeks.  FOLLOW-UP 12. You should see your doctor in the office for a follow-up appointment approximately 2-3 weeks after your surgery.  You should  have been given your post-op/follow-up appointment when your surgery was scheduled.  If you did not receive a post-op/follow-up appointment, make sure that you call for this appointment within a day or two after you arrive home to insure a convenient appointment time.  OTHER INSTRUCTIONS  WHEN TO CALL YOUR DOCTOR: 1. Fever over  101.0 2. Inability to urinate 3. Continued bleeding from incision. 4. Increased pain, redness, or drainage from the incision. 5. Increasing abdominal pain  The clinic staff is available to answer your questions during regular business hours.  Please don't hesitate to call and ask to speak to one of the nurses for clinical concerns.  If you have a medical emergency, go to the nearest emergency room or call 911.  A surgeon from [facility] is always on call at the hospital. [address] [place], [place], [place]  [address] ? [address], [place], [place]   [address] [phone] ? [phone] ? FAX [phone]

## 2021-03-10 NOTE — Op Note (Signed)
Laparoscopic Cholecystectomy with IOC Procedure Note  Indications: This patient presents with symptomatic gallbladder disease and will undergo laparoscopic cholecystectomy. The procedure has been discussed with the patient. Operative and non operative treatments have been discussed. Risks of surgery include bleeding, infection,  Common bile duct injury,  Injury to the stomach,liver, colon,small intestine, abdominal wall,  Diaphragm,  Major blood vessels,  And the need for an open procedure.  Other risks include worsening of medical problems, death,  DVT and pulmonary embolism, and cardiovascular events.   Medical options have also been discussed. The patient has been informed of long term expectations of surgery and non surgical options,  The patient agrees to proceed.    Pre-operative Diagnosis: Calculus of gallbladder with acute cholecystitis, without mention of obstruction  Post-operative Diagnosis: Calculus of gallbladder with acute cholecystitis, without mention of obstruction  Surgeon: Turner Daniels MD   Assistants: Dr Nedra Hai MD   Anesthesia: General endotracheal anesthesia and Local anesthesia 0.25.% bupivacaine  ASA Class: 2  Procedure Details  The patient was seen again in the Holding Room. The risks, benefits, complications, treatment options, and expected outcomes were discussed with the patient. The possibilities of reaction to medication, pulmonary aspiration, perforation of viscus, bleeding, recurrent infection, finding a normal gallbladder, the need for additional procedures, failure to diagnose a condition, the possible need to convert to an open procedure, and creating a complication requiring transfusion or operation were discussed with the patient. The patient and/or family concurred with the proposed plan, giving informed consent. The site of surgery properly noted/marked. The patient was taken to Operating Room, identified as Deanna Randolph and the procedure verified  as Laparoscopic Cholecystectomy with Intraoperative Cholangiograms. A Time Out was held and the above information confirmed.  Prior to the induction of general anesthesia, antibiotic prophylaxis was administered. General endotracheal anesthesia was then administered and tolerated well. After the induction, the abdomen was prepped in the usual sterile fashion. The patient was positioned in the supine position with the left arm comfortably tucked, along with some reverse Trendelenburg.  Local anesthetic agent was injected into the skin near the umbilicus and an incision made. The midline fascia was incised and the Hasson technique was used to introduce a 12 mm port under direct vision. It was secured with a figure of eight Vicryl suture placed in the usual fashion. Pneumoperitoneum was then created with CO2 and tolerated well without any adverse changes in the patient's vital signs. Additional trocars were introduced under direct vision with an 11 mm trocar in the epigastrium and 2 5 mm trocars in the right upper quadrant. All skin incisions were infiltrated with a local anesthetic agent before making the incision and placing the trocars.   The gallbladder was identified, the fundus grasped and retracted cephalad. Adhesions were lysed bluntly and with the electrocautery where indicated, taking care not to injure any adjacent organs or viscus. The infundibulum was grasped and retracted laterally, exposing the peritoneum overlying the triangle of Calot. This was then divided and exposed in a blunt fashion. The cystic duct was clearly identified and bluntly dissected circumferentially. The junctions of the gallbladder, cystic duct and common bile duct were clearly identified prior to the division of any linear structure.   An incision was made in the cystic duct and the cholangiogram catheter introduced. The catheter was secured using an endoclip. The study showed a possible filling defect /  stones and good  visualization of the distal and proximal biliary tree.  Duct was about  8 mm in maximal diameter.  There is also foreshortening of the cystic duct the right.  The catheter was then removed.   The cystic duct was then  ligated with surgical clips  on the patient side and  clipped on the gallbladder side and divided. The cystic artery was identified, dissected free, ligated with clips and divided as well. Posterior cystic artery clipped and divided.   The gallbladder was dissected from the liver bed in retrograde fashion with the electrocautery.  Cautery was used to control bleeding.  The gallbladder was removed. The liver bed was irrigated and inspected. Hemostasis was achieved with the electrocautery and laparoscopic Surgicel snow was applied with no difficulty.. Copious irrigation was utilized prior to this time and was repeatedly aspirated until clear all particulate matter. Hemostasis was achieved with no signs of bleeding or bile leakage.  Pneumoperitoneum was completely reduced after viewing removal of the trocars under direct vision. The wound was thoroughly irrigated and the fascia was then closed with a figure of eight suture; the skin was then closed with 4-0 and a sterile dressing was applied.  Instrument, sponge, and needle counts were correct at closure and at the conclusion of the case.   Findings: Cholecystitis with Cholelithiasis  Estimated Blood Loss: less than 50 mL         Drains: none          Total IV Fluids: per record          Specimens: Gallbladder           Complications: None; patient tolerated the procedure well.         Disposition: PACU - hemodynamically stable.         Condition: stable

## 2021-03-10 NOTE — Anesthesia Procedure Notes (Signed)
Procedure Name: Intubation Date/Time: 03/10/2021 9:59 AM Performed by: Janace Litten, CRNA Pre-anesthesia Checklist: Patient identified, Emergency Drugs available, Suction available and Patient being monitored Patient Re-evaluated:Patient Re-evaluated prior to induction Oxygen Delivery Method: Circle System Utilized Preoxygenation: Pre-oxygenation with 100% oxygen Induction Type: IV induction Ventilation: Mask ventilation without difficulty Laryngoscope Size: Mac and 3 Grade View: Grade III Tube type: Oral Tube size: 7.0 mm Number of attempts: 1 Airway Equipment and Method: Stylet Placement Confirmation: ETT inserted through vocal cords under direct vision,  positive ETCO2 and breath sounds checked- equal and bilateral Secured at: 21 cm Tube secured with: Tape Dental Injury: Teeth and Oropharynx as per pre-operative assessment

## 2021-03-10 NOTE — Transfer of Care (Signed)
Immediate Anesthesia Transfer of Care Note  Patient: Deanna Randolph  Procedure(s) Performed: LAPAROSCOPIC CHOLECYSTECTOMY WITH INTRAOPERATIVE CHOLANGIOGRAM (N/A )  Patient Location: PACU  Anesthesia Type:General  Level of Consciousness: drowsy, patient cooperative and responds to stimulation  Airway & Oxygen Therapy: Patient Spontanous Breathing  Post-op Assessment: Report given to RN and Post -op Vital signs reviewed and stable  Post vital signs: Reviewed and stable  Last Vitals:  Vitals Value Taken Time  BP 101/61 03/10/21 1209  Temp    Pulse 63 03/10/21 1210  Resp 19 03/10/21 1210  SpO2 99 % 03/10/21 1210  Vitals shown include unvalidated device data.  Last Pain:  Vitals:   03/10/21 0910  TempSrc:   PainSc: 4       Patients Stated Pain Goal: 3 (04/17/93 8546)  Complications: No complications documented.

## 2021-03-11 ENCOUNTER — Inpatient Hospital Stay (HOSPITAL_COMMUNITY): Payer: BC Managed Care – PPO

## 2021-03-11 ENCOUNTER — Inpatient Hospital Stay (HOSPITAL_COMMUNITY): Payer: BC Managed Care – PPO | Admitting: Anesthesiology

## 2021-03-11 ENCOUNTER — Encounter (HOSPITAL_COMMUNITY): Admission: RE | Disposition: A | Discharge status: Home / Self Care | Payer: Self-pay | Source: Ambulatory Visit

## 2021-03-11 ENCOUNTER — Encounter (HOSPITAL_COMMUNITY): Payer: Self-pay | Admitting: Surgery

## 2021-03-11 DIAGNOSIS — I10 Essential (primary) hypertension: Secondary | ICD-10-CM | POA: Diagnosis present

## 2021-03-11 DIAGNOSIS — Z20822 Contact with and (suspected) exposure to covid-19: Secondary | ICD-10-CM | POA: Diagnosis present

## 2021-03-11 DIAGNOSIS — K5900 Constipation, unspecified: Secondary | ICD-10-CM | POA: Diagnosis present

## 2021-03-11 DIAGNOSIS — K8050 Calculus of bile duct without cholangitis or cholecystitis without obstruction: Secondary | ICD-10-CM | POA: Diagnosis not present

## 2021-03-11 DIAGNOSIS — F40240 Claustrophobia: Secondary | ICD-10-CM | POA: Diagnosis present

## 2021-03-11 DIAGNOSIS — K819 Cholecystitis, unspecified: Secondary | ICD-10-CM | POA: Diagnosis present

## 2021-03-11 DIAGNOSIS — R710 Precipitous drop in hematocrit: Secondary | ICD-10-CM | POA: Diagnosis not present

## 2021-03-11 DIAGNOSIS — K8062 Calculus of gallbladder and bile duct with acute cholecystitis without obstruction: Secondary | ICD-10-CM | POA: Diagnosis present

## 2021-03-11 DIAGNOSIS — Z881 Allergy status to other antibiotic agents status: Secondary | ICD-10-CM | POA: Diagnosis not present

## 2021-03-11 DIAGNOSIS — Z79899 Other long term (current) drug therapy: Secondary | ICD-10-CM | POA: Diagnosis not present

## 2021-03-11 DIAGNOSIS — R1013 Epigastric pain: Secondary | ICD-10-CM | POA: Diagnosis present

## 2021-03-11 DIAGNOSIS — R748 Abnormal levels of other serum enzymes: Secondary | ICD-10-CM | POA: Diagnosis not present

## 2021-03-11 DIAGNOSIS — K810 Acute cholecystitis: Secondary | ICD-10-CM | POA: Diagnosis not present

## 2021-03-11 DIAGNOSIS — K7689 Other specified diseases of liver: Secondary | ICD-10-CM | POA: Diagnosis present

## 2021-03-11 DIAGNOSIS — Z90710 Acquired absence of both cervix and uterus: Secondary | ICD-10-CM | POA: Diagnosis not present

## 2021-03-11 DIAGNOSIS — R932 Abnormal findings on diagnostic imaging of liver and biliary tract: Secondary | ICD-10-CM | POA: Diagnosis not present

## 2021-03-11 DIAGNOSIS — Z79890 Hormone replacement therapy (postmenopausal): Secondary | ICD-10-CM | POA: Diagnosis not present

## 2021-03-11 HISTORY — PX: ERCP: SHX5425

## 2021-03-11 HISTORY — PX: SPHINCTEROTOMY: SHX5544

## 2021-03-11 LAB — CBC
HCT: 31.9 % — ABNORMAL LOW (ref 36.0–46.0)
Hemoglobin: 10.1 g/dL — ABNORMAL LOW (ref 12.0–15.0)
MCH: 27.2 pg (ref 26.0–34.0)
MCHC: 31.7 g/dL (ref 30.0–36.0)
MCV: 86 fL (ref 80.0–100.0)
Platelets: 317 10*3/uL (ref 150–400)
RBC: 3.71 MIL/uL — ABNORMAL LOW (ref 3.87–5.11)
RDW: 13.1 % (ref 11.5–15.5)
WBC: 9.9 10*3/uL (ref 4.0–10.5)
nRBC: 0 % (ref 0.0–0.2)

## 2021-03-11 LAB — HIV ANTIBODY (ROUTINE TESTING W REFLEX): HIV Screen 4th Generation wRfx: NONREACTIVE

## 2021-03-11 LAB — COMPREHENSIVE METABOLIC PANEL
ALT: 436 U/L — ABNORMAL HIGH (ref 0–44)
AST: 702 U/L — ABNORMAL HIGH (ref 15–41)
Albumin: 3.2 g/dL — ABNORMAL LOW (ref 3.5–5.0)
Alkaline Phosphatase: 68 U/L (ref 38–126)
Anion gap: 6 (ref 5–15)
BUN: 10 mg/dL (ref 6–20)
CO2: 24 mmol/L (ref 22–32)
Calcium: 8.9 mg/dL (ref 8.9–10.3)
Chloride: 108 mmol/L (ref 98–111)
Creatinine, Ser: 0.82 mg/dL (ref 0.44–1.00)
GFR, Estimated: >60 mL/min (ref >60)
Glucose, Bld: 119 mg/dL — ABNORMAL HIGH (ref 70–99)
Potassium: 3.9 mmol/L (ref 3.5–5.1)
Sodium: 138 mmol/L (ref 135–145)
Total Bilirubin: 1.9 mg/dL — ABNORMAL HIGH (ref 0.3–1.2)
Total Protein: 6.3 g/dL — ABNORMAL LOW (ref 6.5–8.1)

## 2021-03-11 LAB — SURGICAL PATHOLOGY

## 2021-03-11 SURGERY — ERCP, WITH INTERVENTION IF INDICATED
Anesthesia: General

## 2021-03-11 SURGERY — ENDOSCOPIC RETROGRADE CHOLANGIOPANCREATOGRAPHY (ERCP) WITH PROPOFOL
Anesthesia: General

## 2021-03-11 MED ORDER — ONDANSETRON HCL 4 MG/2ML IJ SOLN
INTRAMUSCULAR | Status: DC | PRN
  Administered 2021-03-11: 4 mg via INTRAVENOUS

## 2021-03-11 MED ORDER — INDOMETHACIN 50 MG RE SUPP
RECTAL | Status: AC
  Filled 2021-03-11: qty 2

## 2021-03-11 MED ORDER — GLUCAGON HCL RDNA (DIAGNOSTIC) 1 MG IJ SOLR
INTRAMUSCULAR | Status: AC
  Filled 2021-03-11: qty 1

## 2021-03-11 MED ORDER — DEXAMETHASONE SODIUM PHOSPHATE 10 MG/ML IJ SOLN
INTRAMUSCULAR | Status: DC | PRN
  Administered 2021-03-11: 5 mg via INTRAVENOUS

## 2021-03-11 MED ORDER — GLUCAGON HCL RDNA (DIAGNOSTIC) 1 MG IJ SOLR
INTRAMUSCULAR | Status: DC | PRN
  Administered 2021-03-11 (×4): .5 mg via INTRAVENOUS

## 2021-03-11 MED ORDER — PROPOFOL 10 MG/ML IV BOLUS
INTRAVENOUS | Status: DC | PRN
  Administered 2021-03-11: 150 mg via INTRAVENOUS

## 2021-03-11 MED ORDER — LIDOCAINE 2% (20 MG/ML) 5 ML SYRINGE
INTRAMUSCULAR | Status: DC | PRN
  Administered 2021-03-11: 80 mg via INTRAVENOUS

## 2021-03-11 MED ORDER — SODIUM CHLORIDE 0.9 % IV SOLN: INTRAVENOUS | Status: DC

## 2021-03-11 MED ORDER — SODIUM CHLORIDE 0.9 % IV SOLN
INTRAVENOUS | Status: DC | PRN
  Administered 2021-03-11: 3 g via INTRAVENOUS

## 2021-03-11 MED ORDER — FENTANYL CITRATE (PF) 250 MCG/5ML IJ SOLN
INTRAMUSCULAR | Status: DC | PRN
  Administered 2021-03-11 (×2): 50 ug via INTRAVENOUS

## 2021-03-11 MED ORDER — PROMETHAZINE HCL 25 MG/ML IJ SOLN: 6.2500 mg | INTRAMUSCULAR | Status: DC | PRN

## 2021-03-11 MED ORDER — OXYCODONE HCL 5 MG PO TABS: 5.0000 mg | ORAL_TABLET | Freq: Once | ORAL | Status: DC | PRN

## 2021-03-11 MED ORDER — PHENOL 1.4 % MT LIQD
1.0000 | OROMUCOSAL | Status: DC | PRN
  Filled 2021-03-11: qty 177

## 2021-03-11 MED ORDER — SUGAMMADEX SODIUM 200 MG/2ML IV SOLN
INTRAVENOUS | Status: DC | PRN
  Administered 2021-03-11: 300 mg via INTRAVENOUS

## 2021-03-11 MED ORDER — SODIUM CHLORIDE 0.9 % IV SOLN
INTRAVENOUS | Status: AC
  Filled 2021-03-11: qty 8

## 2021-03-11 MED ORDER — FENTANYL CITRATE (PF) 100 MCG/2ML IJ SOLN: 25.0000 ug | INTRAMUSCULAR | Status: DC | PRN

## 2021-03-11 MED ORDER — OXYCODONE HCL 5 MG/5ML PO SOLN: 5.0000 mg | Freq: Once | ORAL | Status: DC | PRN

## 2021-03-11 MED ORDER — LACTATED RINGERS IV SOLN: INTRAVENOUS | Status: DC

## 2021-03-11 MED ORDER — MENTHOL 3 MG MT LOZG
1.0000 | LOZENGE | OROMUCOSAL | Status: DC | PRN
  Filled 2021-03-11: qty 9

## 2021-03-11 MED ORDER — MIDAZOLAM HCL 5 MG/5ML IJ SOLN
INTRAMUSCULAR | Status: DC | PRN
  Administered 2021-03-11: 2 mg via INTRAVENOUS

## 2021-03-11 MED ORDER — ROCURONIUM BROMIDE 10 MG/ML (PF) SYRINGE
PREFILLED_SYRINGE | INTRAVENOUS | Status: DC | PRN
  Administered 2021-03-11: 60 mg via INTRAVENOUS
  Administered 2021-03-11 (×2): 20 mg via INTRAVENOUS

## 2021-03-11 SURGICAL SUPPLY — 15 items

## 2021-03-11 NOTE — Progress Notes (Signed)
[facility] Progress Note  1 Day Post-Op  Subjective: CC-  Abdomen sore this morning. Pain is different than prior to surgery. Last night she was having RUQ discomfort, this morning it is more periumbilical. Denies n/v. Tolerated solid food for dinner last night. She is ambulating to the restroom with no issues. No flatus or BM. Tbili 1.9 from 1.5. GI planning EUS +/- ERCP today.  Objective: Vital signs in last 24 hours: Temp:  [97.6 F (36.4 C)-98.5 F (36.9 C)] 97.6 F (36.4 C) (05/20 0531) Pulse Rate:  [58-87] 72 (05/20 0531) Resp:  [12-22] 16 (05/20 0253) BP: (93-152)/(57-83) 120/62 (05/20 0531) SpO2:  [92 %-100 %] 98 % (05/20 0531) Weight:  [76.2 kg] 76.2 kg (05/19 0842) Last BM Date: 03/10/21  Intake/Output from previous day: 05/19 0701 - 05/20 0700 In: 2345.6 [P.O.:300; I.V.:1945.6; IV Piggyback:100] Out: 425 [Urine:400; Blood:25] Intake/Output this shift: No intake/output data recorded.  PE: Gen:  Alert, NAD, pleasant Pulm: rate and effort normal Abd: Soft, ND, +BS, lap incisions C/D/I - ecchymosis noted around periumbilical incision, mild RUQ and periumbilical TTP without rebound or guarding  Lab Results:  Recent Labs    03/10/21 0825 03/11/21 0051  WBC 5.9 9.9  HGB 11.5* 10.1*  HCT 36.6 31.9*  PLT 331 317   BMET Recent Labs    03/10/21 0825 03/11/21 0051  NA 140 138  K 3.5 3.9  CL 108 108  CO2 25 24  GLUCOSE 99 119*  BUN 7 10  CREATININE 0.89 0.82  CALCIUM 9.3 8.9   PT/INR No results for input(s): LABPROT, INR in the last 72 hours. CMP     Component Value Date/Time   NA 138 03/11/2021 0051   K 3.9 03/11/2021 0051   CL 108 03/11/2021 0051   CO2 24 03/11/2021 0051   GLUCOSE 119 (H) 03/11/2021 0051   BUN 10 03/11/2021 0051   CREATININE 0.82 03/11/2021 0051   CREATININE 0.64 01/22/2015 1245   CALCIUM 8.9 03/11/2021 0051   PROT 6.3 (L) 03/11/2021 0051   ALBUMIN 3.2 (L) 03/11/2021 0051   AST 702 (H) 03/11/2021 0051   ALT 436  (H) 03/11/2021 0051   ALKPHOS 68 03/11/2021 0051   BILITOT 1.9 (H) 03/11/2021 0051   GFRNONAA >60 03/11/2021 0051   GFRNONAA >89 01/22/2015 1245   GFRAA >89 01/22/2015 1245   Lipase  No results found for: LIPASE     Studies/Results: CT ABDOMEN PELVIS WO CONTRAST  Result Date: 03/09/2021 CLINICAL DATA:  Left lower abdominal pain over the last few days. Cholelithiasis shown on recent ultrasound. EXAM: CT ABDOMEN AND PELVIS WITHOUT CONTRAST TECHNIQUE: Multidetector CT imaging of the abdomen and pelvis was performed following the standard protocol without IV contrast. COMPARISON:  Abdominal ultrasound 03/02/2021 FINDINGS: Lower chest: Linear subsegmental atelectasis or scarring in the right lower lobe adjacent to the hemidiaphragm. Hepatobiliary: Mildly distended gallbladder with suspected mild gallbladder wall thickening, sludge, and multiple gallstones measuring up to about 1.7 cm in diameter. Four small fluid density lesions of the liver, the largest measuring 1.9 by 1.7 cm on image 14 of series 2, likely cysts. Strictly speaking the smaller lesions are technically too small to characterize. Dilated common bile duct measuring up to 1.2 cm in diameter. Pancreas: Unremarkable Spleen: Unremarkable Adrenals/Urinary Tract: Unremarkable Stomach/Bowel: Unremarkable.  Normal appendix. Vascular/Lymphatic: Unremarkable Reproductive: Uterus absent.  Adnexa unremarkable. Other: No supplemental non-categorized findings. Musculoskeletal: Mild facet arthropathy of the lumbosacral junction. IMPRESSION: 1. Distended and thick-walled gallbladder with sludge in gallstones as  well as CBD dilatation. Cannot exclude cholecystitis. Although there is no directly visualized choledocholithiasis, the CBD dilatation raises the possibility of a distal obstructive process. Further workup which might be considered may include nuclear medicine patent biliary scan to assess patency of the biliary tree and cystic duct; and/or MRCP  examination. 2. Subsegmental atelectasis in the right lower lobe. 3. Hepatic cysts. Electronically Signed   By: Van Clines M.D.   On: 03/09/2021 17:12   DG Cholangiogram Operative  Result Date: 03/10/2021 CLINICAL DATA:  60 year old female undergoing laparoscopic cholecystectomy in the setting of cholelithiasis. EXAM: INTRAOPERATIVE CHOLANGIOGRAM TECHNIQUE: Cholangiographic imagesfrom the C-arm fluoroscopic device wassubmitted for interpretation post-operatively. Please see the procedural report for the amount of contrast and the fluoroscopy time utilized. COMPARISON:  03/09/2021 FINDINGS: Intraoperative antegrade injection via the cystic duct which opacifies the common bile duct and central portions of the intrahepatic biliary tree. Gallbladder is removed. Contrast is present within the duodenum as well as the right upper quadrant colon. Possible ovoid filling defect in the mid to distal common bile duct, difficult to discern on single image. This finding may represent overlying bowel gas or other artifactual abnormality. Similar appearing moderate extrahepatic and central intrahepatic biliary ductal dilation. No apparent anomalous anatomical configuration of the biliary tree. IMPRESSION: 1. Possible ovoid filling defect in the mid to distal common bile duct, limited exam due to single acquired image. This finding could represent choledocholithiasis, injected gas, or superimposed overlying bowel gas. ERCP could be considered in the appropriate clinical setting. 2. Moderate intra and extrahepatic biliary ductal dilation, similar to comparison CT. Gradual tapering of the distal common bile duct with no definite obstruction. Ruthann Cancer, MD [facility] Electronically Signed   By: Ruthann Cancer MD   On: 03/10/2021 11:27    Anti-infectives: Anti-infectives (From admission, onward)   Start     Dose/Rate Route Frequency Ordered Stop   03/10/21  0830  ceFAZolin (ANCEF) IVPB 2g/100 mL premix        2 g 200 mL/hr over 30 Minutes Intravenous On call to O.R. 03/10/21 0824 03/10/21 1015       Assessment/Plan HTN - home med  Acute cholecystitis S/p laparoscopic cholecystectomy 5/19 Dr. Brantley Stage - POD#1 - IOC abnormal: possible ovoid filling defect in the mid to distal common bile duct; moderate intra and extrahepatic biliary ductal dilation, gradual tapering of the distal common bile duct with no definite obstruction - LFTs up today with Tbili 1.9 from 1.5. GI has seen and plans for EUS +/- ERCP today.  ID - ancef periop FEN - IVF, NPO VTE - SCDs, hold lovenox for procedure Foley - none Follow up - Dr. Brantley Stage   LOS: 1 day    Wellington Hampshire, [facility] 03/11/2021, 8:30 AM Please see Amion for pager number during day hours 7:00am-4:30pm

## 2021-03-11 NOTE — Op Note (Signed)
[facility] Patient Name: Deanna Randolph Procedure Date : 03/11/2021 MRN: [id number] Attending MD: Carol Ada , MD Date of Birth: 05-22-1961 CSN: [id number] Age: 60 Admit Type: Inpatient Procedure:                ERCP Indications:              Common bile duct stone(s) Providers:                Carol Ada, MD, Vista Lawman, RN, Cletis Athens,                            Technician, Dewitt Hoes, CRNA Referring MD:              Medicines:                General Anesthesia Complications:            No immediate complications. Estimated Blood Loss:     Estimated blood loss was minimal. Procedure:                Pre-Anesthesia Assessment:                           - Prior to the procedure, a History and Physical                            was performed, and patient medications and                            allergies were reviewed. The patient's tolerance of                            previous anesthesia was also reviewed. The risks                            and benefits of the procedure and the sedation                            options and risks were discussed with the patient.                            All questions were answered, and informed consent                            was obtained. Prior Anticoagulants: The patient has                            taken no previous anticoagulant or antiplatelet                            agents. ASA Grade Assessment: II - A patient with                            mild systemic disease. After reviewing the risks  and benefits, the patient was deemed in                            satisfactory condition to undergo the procedure.                           - Sedation was administered by an anesthesia                            professional. General anesthesia was attained.                           After obtaining informed consent, the scope was                            passed under direct vision.  Throughout the                            procedure, the patient's blood pressure, pulse, and                            oxygen saturations were monitored continuously. The                            [id number]-Q180V ([id number] Olympus duodenoscope was                            introduced through the mouth, and used to inject                            contrast into and used to inject contrast into the                            bile duct. The ERCP was extremely difficult due to                            challenging cannulation. The patient tolerated the                            procedure well. Scope In: Scope Out: Findings:      The major papilla was normal. A 15 mm biliary sphincterotomy was made       with a braided traction (standard) sphincterotome using ERBE       electrocautery. There was self limited oozing from the sphincterotomy       which did not require treatment.      The procedure was extremely difficult. The ampulla was very large and       floppy. A limited cannulation of the ampulla was obtained with the       sphincterotome, but the guidewires were not able to cannulate the CBD.       There was a copious amount of spontaneous bile drainage with sludge       throughout the entire case. The sphincterotome was not able to engage       beyond the tip. The decision was made to  use a needle knife to open up       the ampulla, which was successful. The allowed for more drinage of bile,       but a deep cannulation of the CBD was not possible. The needle knife was       employed again to allow for greater access. The bile duct was grossly       visualized draining bile and sludge. All attempts with the Revolution       wire, angled 450 cm wire, and standard 0.035 sphinctertomes failed.       Being able to visualize the bile duct the sphinctertome was able to       engage to the point that the wire from the sphincterotome was able to       engage the ampulla. The sphincterotomy was  extended proximally. Inspite       of grossly visualizing the bile duct cannulation was not possible. Impression:               - The major papilla appeared normal.                           - A biliary sphincterotomy was performed. Recommendation:           - Repeat ERCP tomorrow. Dr. Carlean Purl will attempt. Procedure Code(s):        --- Professional ---                           [phone], Endoscopic retrograde                            cholangiopancreatography (ERCP); with                            sphincterotomy/papillotomy Diagnosis Code(s):        --- Professional ---                           K80.50, Calculus of bile duct without cholangitis                            or cholecystitis without obstruction CPT copyright 2019 [employer]. All rights reserved. The codes documented in this report are preliminary and upon coder review may  be revised to meet current compliance requirements. Carol Ada, MD Carol Ada, MD 03/11/2021 7:23:45 PM This report has been signed electronically. Number of Addenda: 0

## 2021-03-11 NOTE — Transfer of Care (Signed)
Immediate Anesthesia Transfer of Care Note  Patient: Deanna Randolph  Procedure(s) Performed: ENDOSCOPIC RETROGRADE CHOLANGIOPANCREATOGRAPHY (ERCP) (N/A ) SPHINCTEROTOMY  Patient Location: PACU  Anesthesia Type:General  Level of Consciousness: awake, alert  and oriented  Airway & Oxygen Therapy: Patient Spontanous Breathing and Patient connected to nasal cannula oxygen  Post-op Assessment: Report given to RN, Post -op Vital signs reviewed and stable and Patient moving all extremities  Post vital signs: Reviewed and stable  Last Vitals:  Vitals Value Taken Time  BP 121/66 03/11/21 1915  Temp    Pulse 72 03/11/21 1918  Resp 16 03/11/21 1918  SpO2 98 % 03/11/21 1918  Vitals shown include unvalidated device data.  Last Pain:  Vitals:   03/11/21 1507  TempSrc: Oral  PainSc: 6       Patients Stated Pain Goal: 2 (15/94/58 5929)  Complications: No complications documented.

## 2021-03-11 NOTE — Interval H&P Note (Signed)
History and Physical Interval Note:  03/11/2021 3:24 PM  Deanna Randolph  has presented today for surgery, with the diagnosis of ? choledocholithiasis.  The various methods of treatment have been discussed with the patient and family. After consideration of risks, benefits and other options for treatment, the patient has consented to  Procedure(s): ESOPHAGOGASTRODUODENOSCOPY (EGD) WITH PROPOFOL (N/A) UPPER ENDOSCOPIC ULTRASOUND (EUS) LINEAR (N/A) ENDOSCOPIC RETROGRADE CHOLANGIOPANCREATOGRAPHY (ERCP) (N/A) as a surgical intervention.  The patient's history has been reviewed, patient examined, no change in status, stable for surgery.  I have reviewed the patient's chart and labs.  Questions were answered to the patient's satisfaction.     Hermann Dottavio D

## 2021-03-11 NOTE — Anesthesia Preprocedure Evaluation (Addendum)
Anesthesia Evaluation  Patient identified by MRN, date of birth, ID band Patient awake    Reviewed: Allergy & Precautions, NPO status , Patient's Chart, lab work & pertinent test results  Airway Mallampati: III  TM Distance: >3 FB Neck ROM: Full  Mouth opening: Limited Mouth Opening  Dental no notable dental hx.    Pulmonary neg pulmonary ROS,    Pulmonary exam normal breath sounds clear to auscultation       Cardiovascular hypertension, Pt. on medications negative cardio ROS Normal cardiovascular exam Rhythm:Regular Rate:Normal     Neuro/Psych negative neurological ROS  negative psych ROS   GI/Hepatic negative GI ROS, Neg liver ROS, Gallstones, cholecystitis    Endo/Other  negative endocrine ROS  Renal/GU negative Renal ROS  negative genitourinary   Musculoskeletal negative musculoskeletal ROS (+)   Abdominal   Peds negative pediatric ROS (+)  Hematology negative hematology ROS (+)   Anesthesia Other Findings   Reproductive/Obstetrics negative OB ROS                            Anesthesia Physical  Anesthesia Plan  ASA: II  Anesthesia Plan: General   Post-op Pain Management:    Induction: Intravenous  PONV Risk Score and Plan: 3 and Ondansetron, Dexamethasone, Midazolam and Treatment may vary due to age or medical condition  Airway Management Planned: Oral ETT  Additional Equipment: None  Intra-op Plan:   Post-operative Plan: Extubation in OR  Informed Consent: I have reviewed the patients History and Physical, chart, labs and discussed the procedure including the risks, benefits and alternatives for the proposed anesthesia with the patient or authorized representative who has indicated his/her understanding and acceptance.     Dental advisory given  Plan Discussed with: CRNA, Anesthesiologist and Surgeon  Anesthesia Plan Comments:        Anesthesia Quick  Evaluation

## 2021-03-11 NOTE — Anesthesia Postprocedure Evaluation (Signed)
Anesthesia Post Note  Patient: Deanna Randolph  Procedure(s) Performed: ENDOSCOPIC RETROGRADE CHOLANGIOPANCREATOGRAPHY (ERCP) (N/A ) SPHINCTEROTOMY     Patient location during evaluation: PACU Anesthesia Type: General Level of consciousness: awake and alert and oriented Pain management: pain level controlled Vital Signs Assessment: post-procedure vital signs reviewed and stable Respiratory status: spontaneous breathing, nonlabored ventilation and respiratory function stable Cardiovascular status: blood pressure returned to baseline Postop Assessment: no apparent nausea or vomiting Anesthetic complications: no   No complications documented.  Last Vitals:  Vitals:   03/11/21 1957 03/11/21 2040  BP: 138/75 (!) 116/57  Pulse: 79 79  Resp: 18 15  Temp: 37.1 C 37 C  SpO2: 95% 100%    Last Pain:  Vitals:   03/11/21 2040  TempSrc: Oral  PainSc:    Pain Goal: Patients Stated Pain Goal: 2 (03/11/21 0035)                 Brennan Bailey

## 2021-03-11 NOTE — Anesthesia Procedure Notes (Signed)
Procedure Name: Intubation Date/Time: 03/11/2021 3:52 PM Performed by: Trinna Post., CRNA Pre-anesthesia Checklist: Patient identified, Emergency Drugs available, Suction available, Patient being monitored and Timeout performed Patient Re-evaluated:Patient Re-evaluated prior to induction Oxygen Delivery Method: Circle system utilized Preoxygenation: Pre-oxygenation with 100% oxygen Induction Type: IV induction Ventilation: Mask ventilation without difficulty Laryngoscope Size: Mac and 4 Grade View: Grade III Tube type: Oral Tube size: 7.0 mm Number of attempts: 1 Airway Equipment and Method: Stylet Placement Confirmation: ETT inserted through vocal cords under direct vision,  positive ETCO2 and breath sounds checked- equal and bilateral Secured at: 22 cm Tube secured with: Tape Dental Injury: Teeth and Oropharynx as per pre-operative assessment

## 2021-03-12 ENCOUNTER — Inpatient Hospital Stay (HOSPITAL_COMMUNITY): Payer: BC Managed Care – PPO | Admitting: Certified Registered Nurse Anesthetist

## 2021-03-12 ENCOUNTER — Encounter (HOSPITAL_COMMUNITY): Admission: RE | Disposition: A | Discharge status: Home / Self Care | Payer: Self-pay | Source: Ambulatory Visit

## 2021-03-12 DIAGNOSIS — R932 Abnormal findings on diagnostic imaging of liver and biliary tract: Secondary | ICD-10-CM

## 2021-03-12 DIAGNOSIS — R710 Precipitous drop in hematocrit: Secondary | ICD-10-CM

## 2021-03-12 LAB — COMPREHENSIVE METABOLIC PANEL
ALT: 270 U/L — ABNORMAL HIGH (ref 0–44)
AST: 213 U/L — ABNORMAL HIGH (ref 15–41)
Albumin: 2.7 g/dL — ABNORMAL LOW (ref 3.5–5.0)
Alkaline Phosphatase: 75 U/L (ref 38–126)
Anion gap: 7 (ref 5–15)
BUN: 7 mg/dL (ref 6–20)
CO2: 25 mmol/L (ref 22–32)
Calcium: 8.4 mg/dL — ABNORMAL LOW (ref 8.9–10.3)
Chloride: 106 mmol/L (ref 98–111)
Creatinine, Ser: 0.83 mg/dL (ref 0.44–1.00)
GFR, Estimated: >60 mL/min (ref >60)
Glucose, Bld: 84 mg/dL (ref 70–99)
Potassium: 3.6 mmol/L (ref 3.5–5.1)
Sodium: 138 mmol/L (ref 135–145)
Total Bilirubin: 2.4 mg/dL — ABNORMAL HIGH (ref 0.3–1.2)
Total Protein: 5.5 g/dL — ABNORMAL LOW (ref 6.5–8.1)

## 2021-03-12 LAB — CBC
HCT: 26.9 % — ABNORMAL LOW (ref 36.0–46.0)
Hemoglobin: 8.5 g/dL — ABNORMAL LOW (ref 12.0–15.0)
MCH: 27.2 pg (ref 26.0–34.0)
MCHC: 31.6 g/dL (ref 30.0–36.0)
MCV: 86.2 fL (ref 80.0–100.0)
Platelets: 231 10*3/uL (ref 150–400)
RBC: 3.12 MIL/uL — ABNORMAL LOW (ref 3.87–5.11)
RDW: 13.6 % (ref 11.5–15.5)
WBC: 10.2 10*3/uL (ref 4.0–10.5)
nRBC: 0 % (ref 0.0–0.2)

## 2021-03-12 SURGERY — ERCP, WITH INTERVENTION IF INDICATED
Anesthesia: General

## 2021-03-12 MED ORDER — DICLOFENAC SODIUM 1 % EX GEL
2.0000 g | Freq: Two times a day (BID) | CUTANEOUS | Status: DC | PRN
  Administered 2021-03-12: 2 g via TOPICAL
  Filled 2021-03-12: qty 100

## 2021-03-12 NOTE — Progress Notes (Signed)
   Patient Name: Deanna Randolph Date of Encounter: 03/12/2021, 11:27 AM    Subjective  Frustrated and tired Sore in abdomen and some shoulder pain Decided not to repeat ERCP Tolerating clear liquids Husband Dr. Elayne Randolph  present   Objective  BP 128/73 (BP Location: Right Arm)   Pulse 75   Temp 98.3 F (36.8 C) (Oral)   Resp 20   Ht 5\' 4"  (1.626 m)   Wt 76.2 kg   SpO2 100%   BMI 28.84 kg/m  Abdomen soft, mildly tender - appropriate post-op  Recent Labs  Lab 03/10/21 0825 03/11/21 0051 03/12/21 0249  AST 20 702* 213*  ALT 14 436* 270*  ALKPHOS 42 68 75  BILITOT 1.5* 1.9* 2.4*  PROT 7.4 6.3* 5.5*  ALBUMIN 3.8 3.2* 2.7*   CBC Latest Ref Rng & Units 03/12/2021 03/11/2021 03/10/2021  WBC 4.0 - 10.5 K/uL 10.2 9.9 5.9  Hemoglobin 12.0 - 15.0 g/dL 8.5(L) 10.1(L) 11.5(L)  Hematocrit 36.0 - 46.0 % 26.9(L) 31.9(L) 36.6  Platelets 150 - 400 K/uL 231 317 331     Assessment and Plan  Abnormal IOC ? CBD Stone S/p ERCP and biliary sphincterotomy w/o deep cannulation Decreased Hgb (no signs of bleeding)   She has decided not to have an ERCP again - for now "and if I have to have another one I will go to [facility]"  I reviewed situation and think observing is sensible. All LFT's better except slight rise in bilirubin which could be a lag from prior or related to ERCP itself No lipase but no signs of pancreatitis.  She and husband aware if there was a stone it may pass now. She is open to trying an open MRI if needed (outpt). When I discussed possible EUS as an option "no"  No reports of bleeding - Hgb down post-procedure - ? dilutional  So advance diet as tolerated, recheck labs tomorrow ( did not discuss Hgb decrease today - we focused on bili and CBD issues)  We will round for Dr. Benson Randolph again tomorrow. As long as she is stable to improved clinically should be ok to go home - I would not hold her for persistent increase in bili unless there were major increases.  Deanna Mayer, MD, [facility] 03/12/2021 11:27 AM

## 2021-03-12 NOTE — Progress Notes (Signed)
[facility] Progress Note  1 Day Post-Op  Subjective: CC-  Abdomen sore this morning.  Tbili 2.4 from 1.9 s/p ERCP yesterday.  Objective: Vital signs in last 24 hours: Temp:  [97.3 F (36.3 C)-99.4 F (37.4 C)] 99.4 F (37.4 C) (05/21 0301) Pulse Rate:  [75-94] 83 (05/21 0301) Resp:  [15-20] 15 (05/21 0301) BP: (109-158)/(57-75) 125/70 (05/21 0301) SpO2:  [94 %-100 %] 95 % (05/21 0301) Last BM Date: 03/10/21  Intake/Output from previous day: 05/20 0701 - 05/21 0700 In: 1320 [P.O.:120; I.V.:1000; IV Piggyback:100] Out: 200 [Urine:200] Intake/Output this shift: Total I/O In: 727 [I.V.:727] Out: -   PE: Gen:  Alert, NAD, pleasant Pulm: rate and effort normal Abd: Soft, ND, +BS, lap incisions C/D/I - mild ecchymosis noted around periumbilical incision  Lab Results:  Recent Labs    03/11/21 0051 03/12/21 0249  WBC 9.9 10.2  HGB 10.1* 8.5*  HCT 31.9* 26.9*  PLT 317 231   BMET Recent Labs    03/11/21 0051 03/12/21 0249  NA 138 138  K 3.9 3.6  CL 108 106  CO2 24 25  GLUCOSE 119* 84  BUN 10 7  CREATININE 0.82 0.83  CALCIUM 8.9 8.4*   PT/INR No results for input(s): LABPROT, INR in the last 72 hours. CMP     Component Value Date/Time   NA 138 03/12/2021 0249   K 3.6 03/12/2021 0249   CL 106 03/12/2021 0249   CO2 25 03/12/2021 0249   GLUCOSE 84 03/12/2021 0249   BUN 7 03/12/2021 0249   CREATININE 0.83 03/12/2021 0249   CREATININE 0.64 01/22/2015 1245   CALCIUM 8.4 (L) 03/12/2021 0249   PROT 5.5 (L) 03/12/2021 0249   ALBUMIN 2.7 (L) 03/12/2021 0249   AST 213 (H) 03/12/2021 0249   ALT 270 (H) 03/12/2021 0249   ALKPHOS 75 03/12/2021 0249   BILITOT 2.4 (H) 03/12/2021 0249   GFRNONAA >60 03/12/2021 0249   GFRNONAA >89 01/22/2015 1245   GFRAA >89 01/22/2015 1245   Lipase  No results found for: LIPASE     Studies/Results: DG Cholangiogram Operative  Result Date: 03/10/2021 CLINICAL DATA:  60 year old female undergoing laparoscopic  cholecystectomy in the setting of cholelithiasis. EXAM: INTRAOPERATIVE CHOLANGIOGRAM TECHNIQUE: Cholangiographic imagesfrom the C-arm fluoroscopic device wassubmitted for interpretation post-operatively. Please see the procedural report for the amount of contrast and the fluoroscopy time utilized. COMPARISON:  03/09/2021 FINDINGS: Intraoperative antegrade injection via the cystic duct which opacifies the common bile duct and central portions of the intrahepatic biliary tree. Gallbladder is removed. Contrast is present within the duodenum as well as the right upper quadrant colon. Possible ovoid filling defect in the mid to distal common bile duct, difficult to discern on single image. This finding may represent overlying bowel gas or other artifactual abnormality. Similar appearing moderate extrahepatic and central intrahepatic biliary ductal dilation. No apparent anomalous anatomical configuration of the biliary tree. IMPRESSION: 1. Possible ovoid filling defect in the mid to distal common bile duct, limited exam due to single acquired image. This finding could represent choledocholithiasis, injected gas, or superimposed overlying bowel gas. ERCP could be considered in the appropriate clinical setting. 2. Moderate intra and extrahepatic biliary ductal dilation, similar to comparison CT. Gradual tapering of the distal common bile duct with no definite obstruction. Ruthann Cancer, MD [facility] Radiology Electronically Signed   By: Ruthann Cancer MD   On: 03/10/2021 11:27    Anti-infectives: Anti-infectives (From admission, onward)   Start  Dose/Rate Route Frequency Ordered Stop   03/10/21 0830  ceFAZolin (ANCEF) IVPB 2g/100 mL premix        2 g 200 mL/hr over 30 Minutes Intravenous On call to O.R. 03/10/21 0824 03/10/21 1015       Assessment/Plan HTN - home med  Acute cholecystitis S/p laparoscopic cholecystectomy 5/19 Dr. Brantley Stage - POD#2 - IOC  abnormal: possible ovoid filling defect in the mid to distal common bile duct; moderate intra and extrahepatic biliary ductal dilation, gradual tapering of the distal common bile duct with no definite obstruction - ERCP yesterday, unable to canalize the CBD but sphincterotomy performed with good drainage - LFTs down today but Tbili up to 2.4.  Could be due to swelling and manipulation yesterday.  Will recheck in AM.   GI to see later today  ID - ancef periop FEN - IVF, try clears today VTE - SCDs, Lovenox Foley - none Follow up - Dr. Brantley Stage   LOS: 2 days   Rosario Adie, MD  [facility] Please see Amion for pager number during day hours 7:00am-4:30pm

## 2021-03-12 NOTE — Plan of Care (Signed)

## 2021-03-13 ENCOUNTER — Encounter (HOSPITAL_COMMUNITY): Payer: Self-pay | Admitting: Gastroenterology

## 2021-03-13 DIAGNOSIS — K810 Acute cholecystitis: Secondary | ICD-10-CM

## 2021-03-13 DIAGNOSIS — R748 Abnormal levels of other serum enzymes: Secondary | ICD-10-CM

## 2021-03-13 LAB — COMPREHENSIVE METABOLIC PANEL
ALT: 201 U/L — ABNORMAL HIGH (ref 0–44)
AST: 143 U/L — ABNORMAL HIGH (ref 15–41)
Albumin: 2.6 g/dL — ABNORMAL LOW (ref 3.5–5.0)
Alkaline Phosphatase: 76 U/L (ref 38–126)
Anion gap: 8 (ref 5–15)
BUN: 8 mg/dL (ref 6–20)
CO2: 27 mmol/L (ref 22–32)
Calcium: 8.3 mg/dL — ABNORMAL LOW (ref 8.9–10.3)
Chloride: 105 mmol/L (ref 98–111)
Creatinine, Ser: 0.78 mg/dL (ref 0.44–1.00)
GFR, Estimated: >60 mL/min (ref >60)
Glucose, Bld: 101 mg/dL — ABNORMAL HIGH (ref 70–99)
Potassium: 3.7 mmol/L (ref 3.5–5.1)
Sodium: 140 mmol/L (ref 135–145)
Total Bilirubin: 2.3 mg/dL — ABNORMAL HIGH (ref 0.3–1.2)
Total Protein: 5.5 g/dL — ABNORMAL LOW (ref 6.5–8.1)

## 2021-03-13 LAB — CBC
HCT: 28.2 % — ABNORMAL LOW (ref 36.0–46.0)
Hemoglobin: 9 g/dL — ABNORMAL LOW (ref 12.0–15.0)
MCH: 27.8 pg (ref 26.0–34.0)
MCHC: 31.9 g/dL (ref 30.0–36.0)
MCV: 87 fL (ref 80.0–100.0)
Platelets: 237 10*3/uL (ref 150–400)
RBC: 3.24 MIL/uL — ABNORMAL LOW (ref 3.87–5.11)
RDW: 13.6 % (ref 11.5–15.5)
WBC: 6.8 10*3/uL (ref 4.0–10.5)
nRBC: 0 % (ref 0.0–0.2)

## 2021-03-13 MED ORDER — DOCUSATE SODIUM 100 MG PO CAPS: 100.0000 mg | ORAL_CAPSULE | Freq: Two times a day (BID) | ORAL | 0 refills | Status: DC

## 2021-03-13 MED ORDER — OXYCODONE HCL 5 MG PO TABS: 5.0000 mg | ORAL_TABLET | Freq: Four times a day (QID) | ORAL | 0 refills | Status: DC | PRN

## 2021-03-13 MED ORDER — OXYCODONE HCL 5 MG PO TABS: 5.0000 mg | ORAL_TABLET | Freq: Four times a day (QID) | ORAL | Status: DC | PRN

## 2021-03-13 NOTE — Discharge Summary (Signed)
Patient ID: Sharine Cadle [id number] y.o. 06/06/1961  03/10/2021  Discharge date and time: 03/13/2021 12:11 PM  Admitting Physician: CCS, MD  Discharge Physician: CCS, MD  Admission Diagnoses: Acute cholecystitis [K81.0] Cholecystitis [K81.9] Choledocholithiasis  Discharge Diagnoses: Acute cholecystitis [K81.0]  Choledocholithiasis  Operations: Procedure(s): ENDOSCOPIC RETROGRADE CHOLANGIOPANCREATOGRAPHY (ERCP) SPHINCTEROTOMY Lap Cholecystectomy    Discharged Condition: good    Hospital Course: Patient was admitted with acute cholecystitis.  She was taken to the OR for lap chole.  IOC showed a filling defect in her CBD.  GI was consulted for ERCP.  ERCP performed with sphincterotomy.  Unable to completely cannulize the CBD.  T bili followed post op and showed some down trending.  She was felt to be in stable condition for discharge to home with close follow up by GI and surgery.  Plan to check LFT's later this week.    Consults: GI  Significant Diagnostic Studies: labs: cbc, cmet  Treatments: IV hydration, analgesia: acetaminophen, procedures: ERCP with sphicnterotomy and surgery: lap chole  Disposition: Home

## 2021-03-13 NOTE — Progress Notes (Signed)
[facility] Progress Note  2 Days Post-Op  Subjective: CC-  Abdomen less sore this morning.  Tolerated a diet without any additional pain  Objective: Vital signs in last 24 hours: Temp:  [97.5 F (36.4 C)-100 F (37.8 C)] 97.5 F (36.4 C) (05/22 0540) Pulse Rate:  [69-79] 69 (05/22 0540) Resp:  [17-22] 18 (05/22 0540) BP: (119-142)/(69-76) 119/69 (05/22 0540) SpO2:  [95 %-100 %] 95 % (05/22 0540) Last BM Date: 03/10/21  Intake/Output from previous day: 05/21 0701 - 05/22 0700 In: 1247 [P.O.:520; I.V.:727] Out: -  Intake/Output this shift: No intake/output data recorded.  PE: Gen:  Alert, NAD, pleasant Pulm: rate and effort normal Abd: Soft, ND Lab Results:  Recent Labs    03/12/21 0249 03/13/21 0200  WBC 10.2 6.8  HGB 8.5* 9.0*  HCT 26.9* 28.2*  PLT 231 237   BMET Recent Labs    03/12/21 0249 03/13/21 0200  NA 138 140  K 3.6 3.7  CL 106 105  CO2 25 27  GLUCOSE 84 101*  BUN 7 8  CREATININE 0.83 0.78  CALCIUM 8.4* 8.3*   PT/INR No results for input(s): LABPROT, INR in the last 72 hours. CMP     Component Value Date/Time   NA 140 03/13/2021 0200   K 3.7 03/13/2021 0200   CL 105 03/13/2021 0200   CO2 27 03/13/2021 0200   GLUCOSE 101 (H) 03/13/2021 0200   BUN 8 03/13/2021 0200   CREATININE 0.78 03/13/2021 0200   CREATININE 0.64 01/22/2015 1245   CALCIUM 8.3 (L) 03/13/2021 0200   PROT 5.5 (L) 03/13/2021 0200   ALBUMIN 2.6 (L) 03/13/2021 0200   AST 143 (H) 03/13/2021 0200   ALT 201 (H) 03/13/2021 0200   ALKPHOS 76 03/13/2021 0200   BILITOT 2.3 (H) 03/13/2021 0200   GFRNONAA >60 03/13/2021 0200   GFRNONAA >89 01/22/2015 1245   GFRAA >89 01/22/2015 1245   Lipase  No results found for: LIPASE     Studies/Results: DG ERCP BILIARY & PANCREATIC DUCTS  Result Date: 03/12/2021 CLINICAL DATA:  Possible choledocholithiasis EXAM: ERCP TECHNIQUE: Multiple spot images obtained with the fluoroscopic device and submitted for  interpretation post-procedure. FLUOROSCOPY TIME:  Fluoroscopy Time:  0 minutes 25 seconds Radiation Exposure Index (if provided by the fluoroscopic device): 4.5 Number of Acquired Spot Images: 0 COMPARISON:  CT abdomen/pelvis 03/09/2021 FINDINGS: A single saved intraoperative image is submitted for review. The image demonstrates a flexible duodenal scope in the descending duodenum. IMPRESSION: Reportedly unsuccessful ERCP. These images were submitted for radiologic interpretation only. Please see the procedural report for the amount of contrast and the fluoroscopy time utilized. Electronically Signed   By: Jacqulynn Cadet M.D.   On: 03/12/2021 09:47    Anti-infectives: Anti-infectives (From admission, onward)   Start     Dose/Rate Route Frequency Ordered Stop   03/10/21 0830  ceFAZolin (ANCEF) IVPB 2g/100 mL premix        2 g 200 mL/hr over 30 Minutes Intravenous On call to O.R. 03/10/21 0824 03/10/21 1015       Assessment/Plan HTN - home med  Acute cholecystitis S/p laparoscopic cholecystectomy 5/19 Dr. Brantley Stage - POD#3 - IOC abnormal: possible ovoid filling defect in the mid to distal common bile duct; moderate intra and extrahepatic biliary ductal dilation, gradual tapering of the distal common bile duct with no definite obstruction - ERCP 5/20: unable to canalize the CBD but sphincterotomy performed with good drainage - LFTs down again today Tbili 2.3 from 2.4.  GI to see later today.  Per Dr Celesta Aver note from yesterday, she should be good to go home today as long as Tbili not precipitously elevated  ID - ancef periop FEN - soft diet VTE - SCDs, Lovenox Foley - none Follow up - Dr. Brantley Stage and Dr Benson [place]   LOS: 3 days   Rosario Adie, MD  Colorectal [facility] Please see Amion for pager number during day hours 7:00am-4:30pm

## 2021-03-13 NOTE — Progress Notes (Signed)
Deanna Randolph to be D/C'd per MD order. Discussed with the patient and all questions fully answered. ? VSS, Skin clean, dry and intact without evidence of skin break down, no evidence of skin tears noted. ? IV catheter discontinued intact. Site without signs and symptoms of complications. Dressing and pressure applied. ? An After Visit Summary was printed and given to the patient. Patient informed where to pickup prescriptions. ? D/c education completed with patient/family including follow up instructions, medication list, d/c activities limitations if indicated, with other d/c instructions as indicated by MD - patient able to verbalize understanding, all questions fully answered.  ? Patient instructed to return to ED, call 911, or call MD for any changes in condition.  ? Patient to be escorted via [place], and D/C home via private auto.

## 2021-03-13 NOTE — Progress Notes (Signed)
     [facility] Progress Note  CC:  Feeling a little better  Assessment / Plan: Lap chole 03/10/21 with IOC showing possible filling defect. ERCP 5/20 was limited due to inability to canalize the CBD but sphincterotomy performed with good drainage. She declined further attempt at ERCP. Liver enzymes are improving, including total bilirubin. She sore but feeling better.   Hemoglobin also noted to be low, but it has stabilized and there has been no overt GI bleeding.  Management per surgery. No indications for continued hospitalization for GI perspective. Recommend follow-up liver enzymes later this week with surgery or Dr. Collene Mares. I sent Dr. Collene Mares a message in EPIC with an update.   The inpatient GI team will move to stand-by. Please call the on-call gastroenterologist for Dr. Collene Mares with any additional questions or concerns during this hospitalization.   Subjective: Feeling a little better. Hoping to go home.  Husband at the bedside  Objective:  Vital signs in last 24 hours: Temp:  [97.5 F (36.4 C)-100 F (37.8 C)] 97.5 F (36.4 C) (05/22 0540) Pulse Rate:  [69-79] 69 (05/22 0540) Resp:  [17-22] 18 (05/22 0540) BP: (119-142)/(69-76) 119/69 (05/22 0540) SpO2:  [95 %-100 %] 95 % (05/22 0540) Last BM Date: 03/10/21 General:   Alert, in NAD. Sitting in the bedside chair.  Abdomen:  Soft. Incisions show appropriate granulation tissue. No erythema, induration, or warmth. Normal bowel sounds. No rebound or guarding. Neurologic:  Alert and  oriented x4;  grossly normal neurologically. Psych:  Alert and cooperative. Normal mood and affect.  Lab Results: Recent Labs    03/11/21 0051 03/12/21 0249 03/13/21 0200  WBC 9.9 10.2 6.8  HGB 10.1* 8.5* 9.0*  HCT 31.9* 26.9* 28.2*  PLT 317 231 237   BMET Recent Labs    03/11/21 0051 03/12/21 0249 03/13/21 0200  NA 138 138 140  K 3.9 3.6 3.7  CL 108 106 105  CO2 24 25 27   GLUCOSE 119* 84 101*  BUN 10 7 8   CREATININE 0.82  0.83 0.78  CALCIUM 8.9 8.4* 8.3*   LFT Recent Labs    03/13/21 0200  PROT 5.5*  ALBUMIN 2.6*  AST 143*  ALT 201*  ALKPHOS 76  BILITOT 2.3*   DG ERCP BILIARY & PANCREATIC DUCTS  Result Date: 03/12/2021 CLINICAL DATA:  Possible choledocholithiasis EXAM: ERCP TECHNIQUE: Multiple spot images obtained with the fluoroscopic device and submitted for interpretation post-procedure. FLUOROSCOPY TIME:  Fluoroscopy Time:  0 minutes 25 seconds Radiation Exposure Index (if provided by the fluoroscopic device): 4.5 Number of Acquired Spot Images: 0 COMPARISON:  CT abdomen/pelvis 03/09/2021 FINDINGS: A single saved intraoperative image is submitted for review. The image demonstrates a flexible duodenal scope in the descending duodenum. IMPRESSION: Reportedly unsuccessful ERCP. These images were submitted for radiologic interpretation only. Please see the procedural report for the amount of contrast and the fluoroscopy time utilized. Electronically Signed   By: Jacqulynn Cadet M.D.   On: 03/12/2021 09:47      LOS: 3 days   [place]  03/13/2021, 10:09 AM

## 2021-03-17 ENCOUNTER — Other Ambulatory Visit: Payer: Self-pay

## 2021-03-17 DIAGNOSIS — Z1211 Encounter for screening for malignant neoplasm of colon: Secondary | ICD-10-CM | POA: Diagnosis not present

## 2021-03-17 DIAGNOSIS — R194 Change in bowel habit: Secondary | ICD-10-CM | POA: Diagnosis not present

## 2021-03-17 DIAGNOSIS — D509 Iron deficiency anemia, unspecified: Secondary | ICD-10-CM | POA: Diagnosis not present

## 2021-03-17 DIAGNOSIS — R945 Abnormal results of liver function studies: Secondary | ICD-10-CM | POA: Diagnosis not present

## 2021-03-17 MED ORDER — LOSARTAN POTASSIUM 100 MG PO TABS: 100.0000 mg | ORAL_TABLET | Freq: Every day | ORAL | 1 refills | Status: DC

## 2021-04-13 ENCOUNTER — Telehealth: Payer: Self-pay | Admitting: Internal Medicine

## 2021-04-13 NOTE — Telephone Encounter (Signed)
scheduled

## 2021-04-13 NOTE — Telephone Encounter (Signed)
Deanna Randolph [phone]  [name] called and would like to get TDAP injection her daughter is expecting a baby soon.

## 2021-04-14 DIAGNOSIS — R945 Abnormal results of liver function studies: Secondary | ICD-10-CM | POA: Diagnosis not present

## 2021-04-21 ENCOUNTER — Ambulatory Visit: Payer: BC Managed Care – PPO | Admitting: Internal Medicine

## 2021-04-21 ENCOUNTER — Ambulatory Visit (INDEPENDENT_AMBULATORY_CARE_PROVIDER_SITE_OTHER): Payer: BC Managed Care – PPO | Admitting: Internal Medicine

## 2021-04-21 ENCOUNTER — Encounter: Payer: Self-pay | Admitting: Internal Medicine

## 2021-04-21 ENCOUNTER — Other Ambulatory Visit: Payer: Self-pay

## 2021-04-21 VITALS — BP 130/80 | HR 84 | Temp 98.4°F | Ht 64.0 in | Wt 165.0 lb

## 2021-04-21 DIAGNOSIS — Z23 Encounter for immunization: Secondary | ICD-10-CM

## 2021-05-14 NOTE — Progress Notes (Signed)
Patient called about needing Tdap update as daughter is expecting a baby soon. This was given today by CMA. MJB,MD

## 2021-05-14 NOTE — Patient Instructions (Addendum)
Tdap update given by CMA

## 2021-07-18 ENCOUNTER — Other Ambulatory Visit: Payer: Self-pay

## 2021-07-18 ENCOUNTER — Encounter: Payer: Self-pay | Admitting: Internal Medicine

## 2021-07-18 ENCOUNTER — Ambulatory Visit (INDEPENDENT_AMBULATORY_CARE_PROVIDER_SITE_OTHER): Payer: BC Managed Care – PPO | Admitting: Internal Medicine

## 2021-07-18 ENCOUNTER — Ambulatory Visit: Payer: BC Managed Care – PPO | Admitting: Internal Medicine

## 2021-07-18 VITALS — BP 138/82 | HR 63 | Temp 98.6°F | Resp 16 | Ht 64.0 in | Wt 170.0 lb

## 2021-07-18 DIAGNOSIS — L0293 Carbuncle, unspecified: Secondary | ICD-10-CM

## 2021-07-18 DIAGNOSIS — I10 Essential (primary) hypertension: Secondary | ICD-10-CM

## 2021-07-18 MED ORDER — FLUCONAZOLE 150 MG PO TABS: 150.0000 mg | ORAL_TABLET | Freq: Once | ORAL | 0 refills | Status: AC

## 2021-07-18 MED ORDER — BACTROBAN NASAL 2 % NA OINT: TOPICAL_OINTMENT | NASAL | 0 refills | Status: DC

## 2021-07-18 MED ORDER — DOXYCYCLINE HYCLATE 100 MG PO TABS: 100.0000 mg | ORAL_TABLET | Freq: Two times a day (BID) | ORAL | 0 refills | Status: DC

## 2021-07-18 NOTE — Patient Instructions (Addendum)
Use Bactroban in each nostril for at least 5 days.  Doxycycline 100 mg twice daily for 10 days.  May take Diflucan if you get Candida vaginitis while taking antibiotics.  Use Hibiclens or Dial soap to bathe the.  Leave soap on for 5 minutes and rinse off well.  Use clean washcloth daily and clean towel daily.  Let me know if not improving.

## 2021-07-18 NOTE — Progress Notes (Signed)
   Subjective:    Patient ID: Deanna Randolph, female    DOB: June 02, 1961, 60 y.o.   MRN: [id number]  HPI  60 year old Female underwent elective laparoscopic cholecystectomy in May.  Patient sent MyChart message yesterday that she had a boil in pubic hair area for over 2 weeks.  She tried triple antibiotic cream and Bactroban.  Intolerant of cephalexin.  Office visit was advised today.  She has a history of hypertension currently just taking losartan 100 mg daily.  Review of Systems no fever, chills, drainage from skin lesion.     Objective:   Physical Exam Temperature 98.6 degrees respiratory rate 16 pulse 63 regular blood pressure 138/82 weight 170 pounds BMI 29.18 height 5 feet 4 inches  Skin: In the mid pubic area there is a nickel sized carbuncle that currently is not draining.  It is slightly raised.  It is red.       Assessment & Plan:  Carbuncle pubic area  Plan: This is likely Staph aureus infection.  May have come in contact with Staph aureus when she was hospitalized for cholecystectomy.  I have prescribed Bactroban to each nostril for at least 5 days.  Doxycycline 100 mg twice daily for 10 days.  Diflucan if get Candida vaginitis while taking antibiotics.  Use either Hibiclens or Dial soap today.  Lather the area up with soap leave on for 5 minutes and rinse off well.  Use clean wash cloth daily.  Use clean towel daily.  This may be a stubborn infection and hard to eradicate.  Let me know if not getting better.

## 2021-08-12 ENCOUNTER — Encounter: Payer: Self-pay | Admitting: Internal Medicine

## 2021-08-21 IMAGING — CT CT ABD-PELV W/O CM
2 of 4 series · 16 of 46 positions shown, 18 images · non-contrast
Comparison: Abdominal ultrasound 03/02/2021

CLINICAL DATA: Left lower abdominal pain over the last few days.
Cholelithiasis shown on recent ultrasound.

EXAM:
CT ABDOMEN AND PELVIS WITHOUT CONTRAST
TECHNIQUE: Multidetector CT imaging of the abdomen and pelvis was performed
following the standard protocol without IV contrast.

[Series 2: axial st · axial · 0.96mm/px · z∈[-444,-68]mm · 13 of 86 slices shown, 15 images]
[im 6/86  soft-tissue]
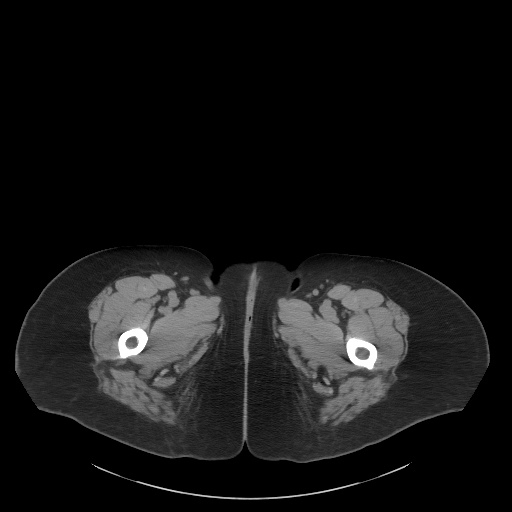
[im 6/86  bone]
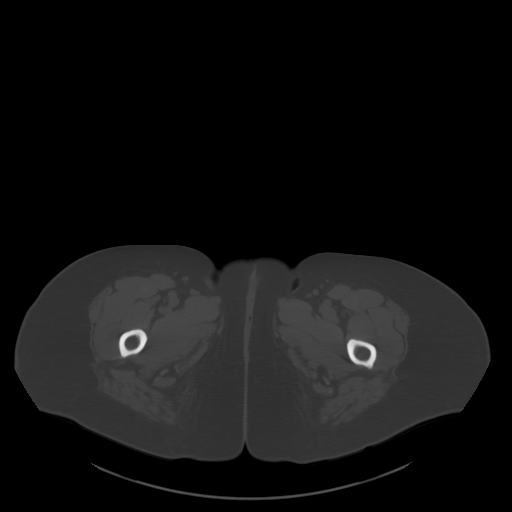
[im 11/86  soft-tissue]
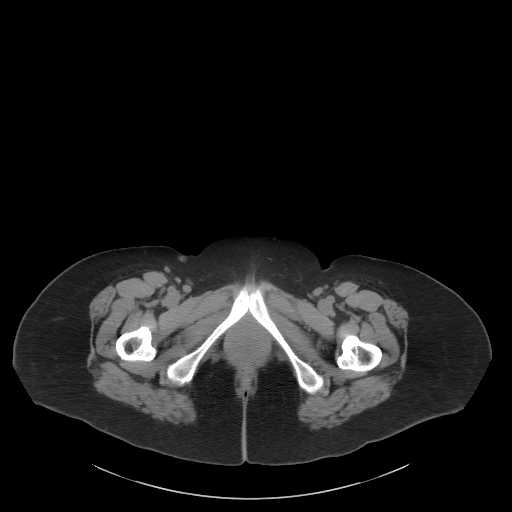
[im 21/86  soft-tissue]
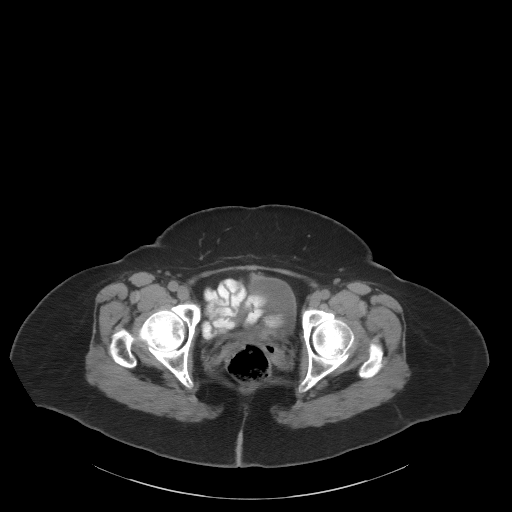
[im 26/86  soft-tissue]
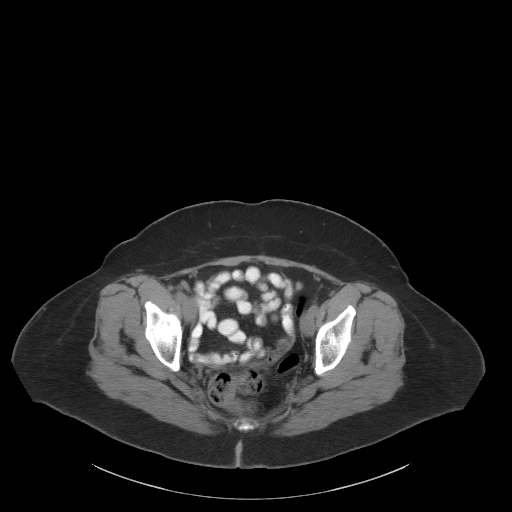
[im 31/86  soft-tissue]
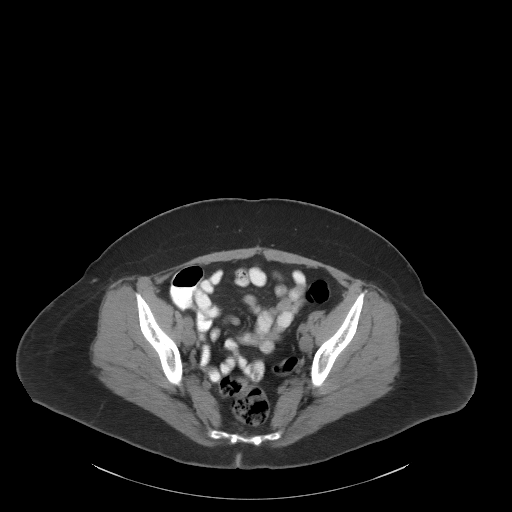
[im 36/86  soft-tissue]
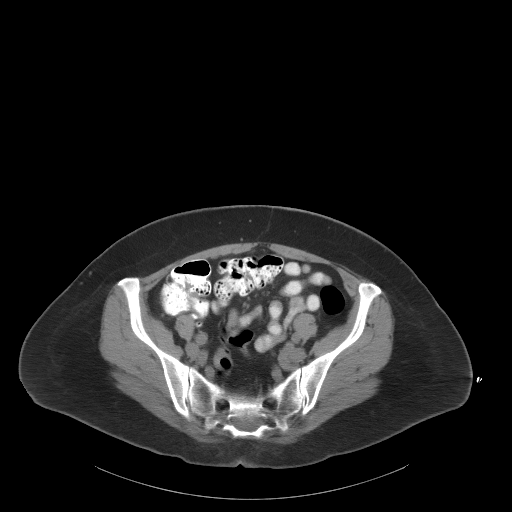
[im 46/86  soft-tissue]
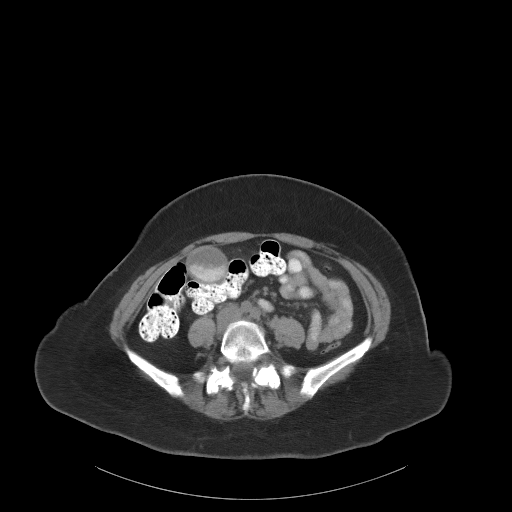
[im 51/86  soft-tissue]
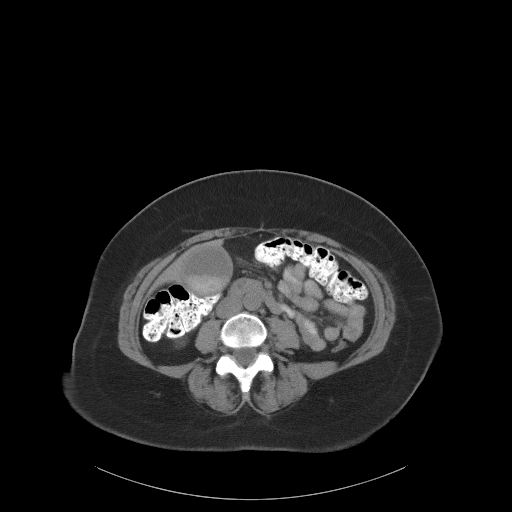
[im 56/86  soft-tissue]
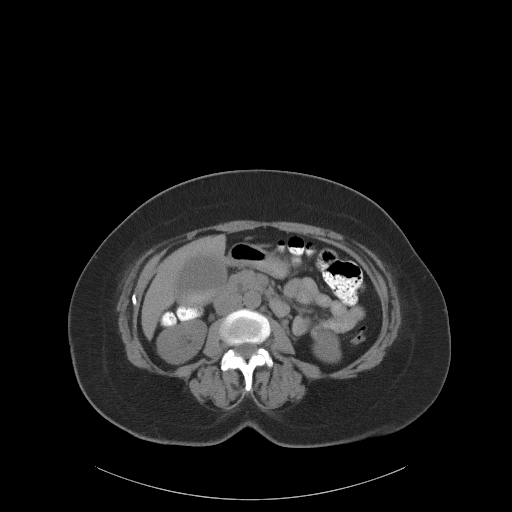
[im 56/86  bone]
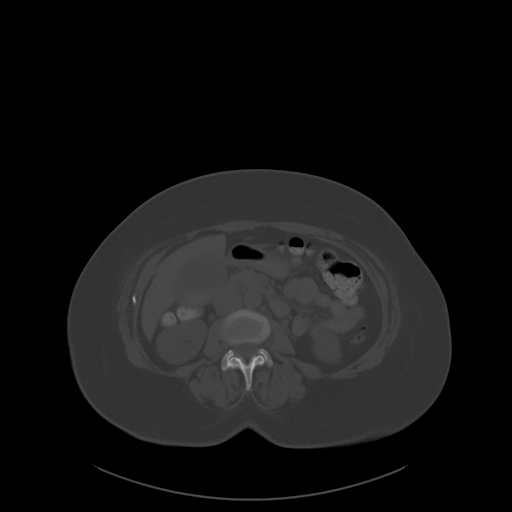
[im 61/86  soft-tissue]
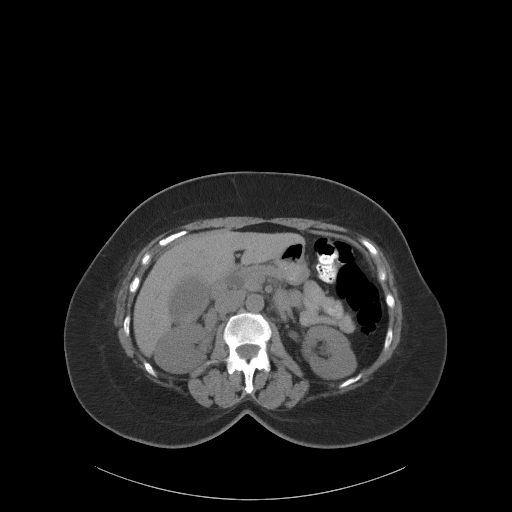
[im 66/86  soft-tissue]
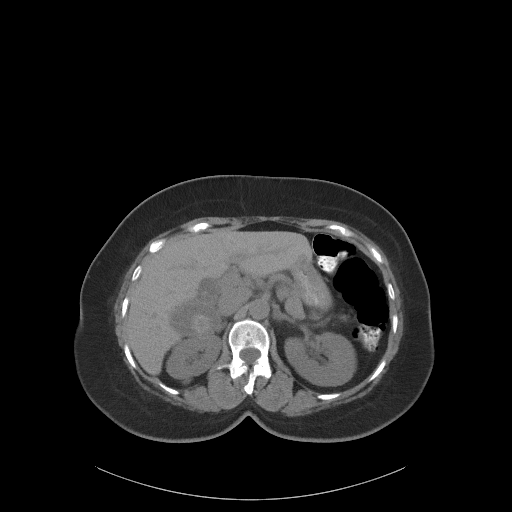
[im 76/86  soft-tissue]
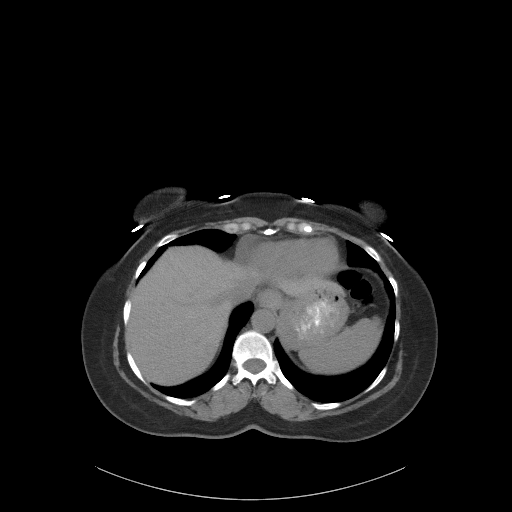
[im 81/86  soft-tissue]
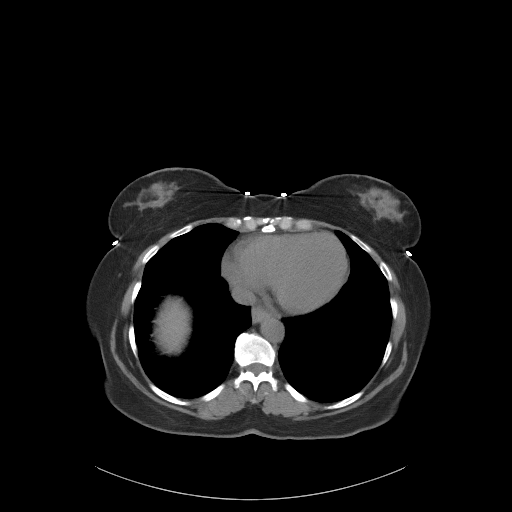

[Series 4: coronal st · coronal · 0.74mm/px · 3 of 101 slices shown]
[im 34/101  soft-tissue]
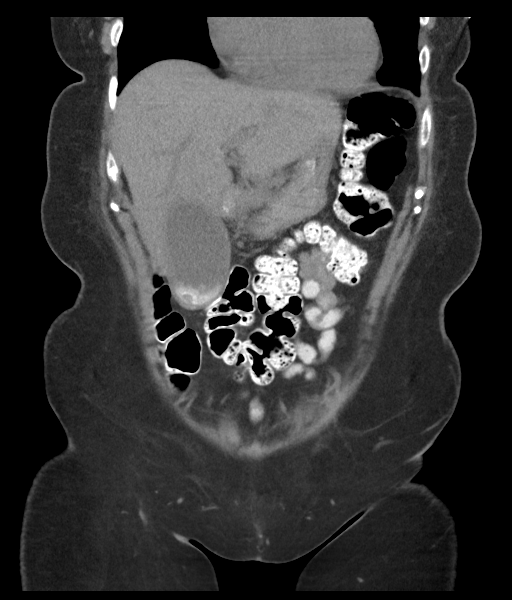
[im 45/101  soft-tissue]
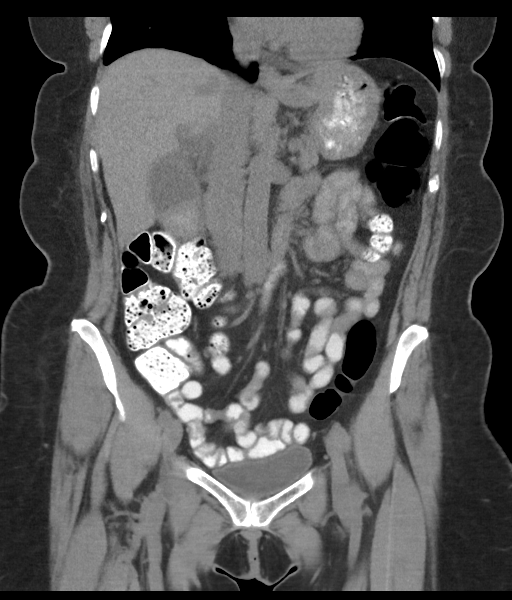
[im 56/101  soft-tissue]
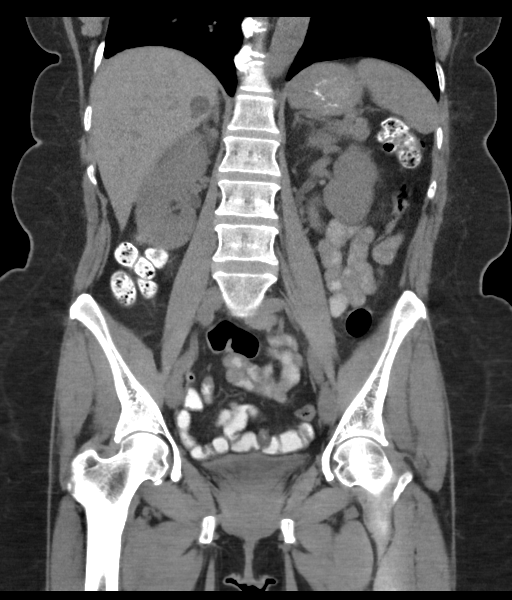

[16 of 46 positions shown; findings below may reference images not displayed]

FINDINGS: Lower chest: Linear subsegmental atelectasis or scarring in the
right lower lobe adjacent to the hemidiaphragm.

Hepatobiliary: Mildly distended gallbladder with suspected mild
gallbladder wall thickening, sludge, and multiple gallstones
measuring up to about 1.7 cm in diameter.

Four small fluid density lesions of the liver, the largest measuring
1.9 by 1.7 cm on image 14 of series 2, likely cysts. Strictly
speaking the smaller lesions are technically too small to
characterize.

Dilated common bile duct measuring up to 1.2 cm in diameter.

Pancreas: Unremarkable

Spleen: Unremarkable

Adrenals/Urinary Tract: Unremarkable

Stomach/Bowel: Unremarkable.  Normal appendix.

Vascular/Lymphatic: Unremarkable

Reproductive: Uterus absent.  Adnexa unremarkable.

Other: No supplemental non-categorized findings.

Musculoskeletal: Mild facet arthropathy of the lumbosacral junction.
IMPRESSION: 1. Distended and thick-walled gallbladder with sludge in gallstones
as well as CBD dilatation. Cannot exclude cholecystitis. Although
there is no directly visualized choledocholithiasis, the CBD
dilatation raises the possibility of a distal obstructive process.
Further workup which might be considered may include nuclear
medicine patent biliary scan to assess patency of the biliary tree
and cystic duct; and/or MRCP examination.
2. Subsegmental atelectasis in the right lower lobe.
3. Hepatic cysts.

## 2021-08-22 IMAGING — RF DG CHOLANGIOGRAM OPERATIVE
1 series · 1 of 1 positions shown · non-contrast
Comparison: 03/09/2021

CLINICAL DATA: 59-year-old female undergoing laparoscopic
cholecystectomy in the setting of cholelithiasis.

EXAM:
INTRAOPERATIVE CHOLANGIOGRAM
TECHNIQUE: Cholangiographic imagesfrom the C-arm fluoroscopic device
wassubmitted for interpretation post-operatively. Please see the
procedural report for the amount of contrast and the fluoroscopy
time utilized.

[Series 1: run · 1 of 1 slices shown]
[im 1/1]
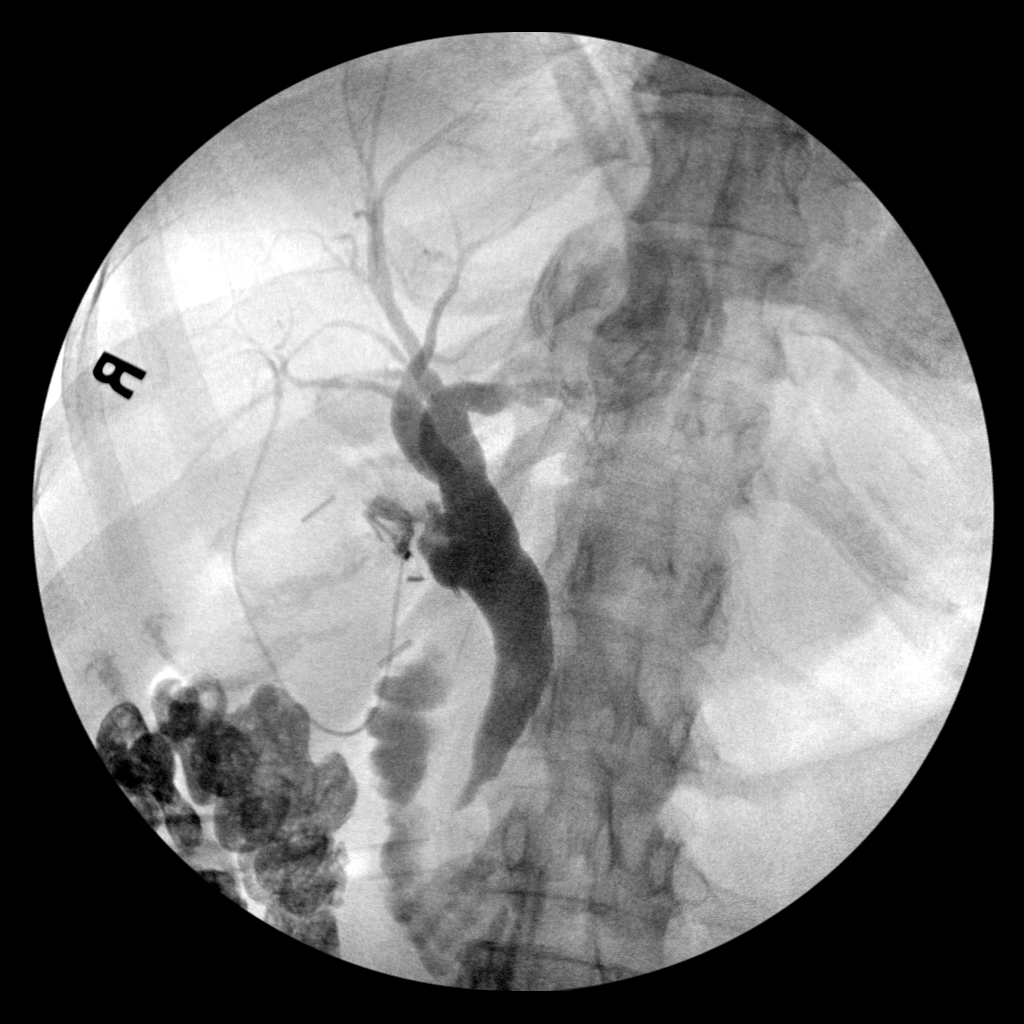

[1 of 1 positions shown; findings below may reference images not displayed]

FINDINGS: Intraoperative antegrade injection via the cystic duct which
opacifies the common bile duct and central portions of the
intrahepatic biliary tree. Gallbladder is removed. Contrast is
present within the duodenum as well as the right upper quadrant
colon. Possible ovoid filling defect in the mid to distal common
bile duct, difficult to discern on single image. This finding may
represent overlying bowel gas or other artifactual abnormality.
Similar appearing moderate extrahepatic and central intrahepatic
biliary ductal dilation. No apparent anomalous anatomical
configuration of the biliary tree.
IMPRESSION: 1. Possible ovoid filling defect in the mid to distal common bile
duct, limited exam due to single acquired image. This finding could
represent choledocholithiasis, injected gas, or superimposed
overlying bowel gas. ERCP could be considered in the appropriate
clinical setting.
2. Moderate intra and extrahepatic biliary ductal dilation, similar
to comparison CT. Gradual tapering of the distal common bile duct
with no definite obstruction.

## 2021-08-23 IMAGING — RF DG ERCP WO/W SPHINCTEROTOMY
1 series · 1 of 1 positions shown · non-contrast
Comparison: CT abdomen/pelvis 03/09/2021

CLINICAL DATA: Possible choledocholithiasis

EXAM:
ERCP
TECHNIQUE: Multiple spot images obtained with the fluoroscopic device and
submitted for interpretation post-procedure.
FLUOROSCOPY TIME:  Fluoroscopy Time:  0 minutes 25 seconds
Radiation Exposure Index (if provided by the fluoroscopic device):
4.5
Number of Acquired Spot Images: 0

[Series 1: unknown protocol · 0.20mm/px · 1 of 1 slices shown]
[im 1/1]
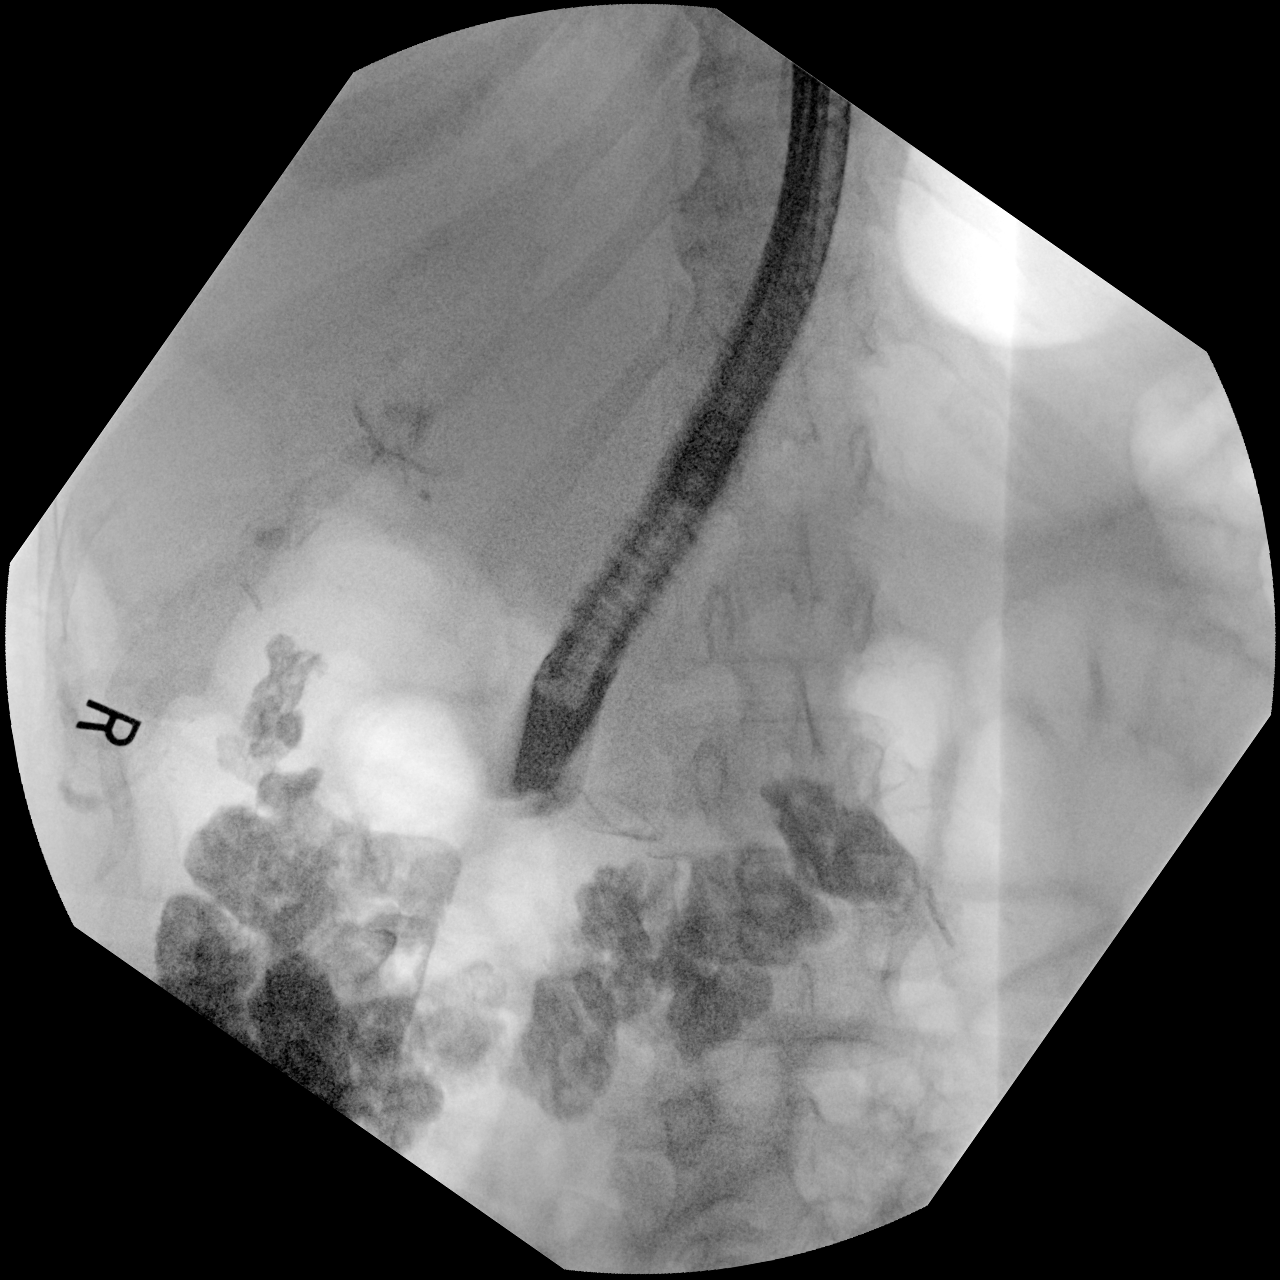

[1 of 1 positions shown; findings below may reference images not displayed]

FINDINGS: A single saved intraoperative image is submitted for review. The
image demonstrates a flexible duodenal scope in the descending
duodenum.
IMPRESSION: Reportedly unsuccessful ERCP.

These images were submitted for radiologic interpretation only.
Please see the procedural report for the amount of contrast and the
fluoroscopy time utilized.

## 2021-08-29 ENCOUNTER — Telehealth: Payer: Self-pay | Admitting: Internal Medicine

## 2021-08-29 NOTE — Telephone Encounter (Signed)
Deanna Randolph [phone]  [name] called to say she tested positive for COVID on 08/20/2021, and she is still testing positive on the antigen test. She was only sick for 2 days, she is wandering why she is still testing positive. I let her know you can still test positive up to 90 days after having COVID, and also let her know she could call the Health Dept, I also let her know that health at work did not advise [place] to test anymore after we first test positive, they advise to quarantine the five days or 48 hours after symptoms have stopped,but she would like for you to call her.

## 2021-09-04 ENCOUNTER — Other Ambulatory Visit: Payer: Self-pay | Admitting: Internal Medicine

## 2021-12-16 ENCOUNTER — Telehealth: Payer: Self-pay | Admitting: Internal Medicine

## 2021-12-16 MED ORDER — ZOLPIDEM TARTRATE 5 MG PO TABS: 5.0000 mg | ORAL_TABLET | Freq: Every evening | ORAL | 1 refills | Status: DC | PRN

## 2021-12-16 NOTE — Telephone Encounter (Signed)
Ref Ambien for upcoming trip to [place].

## 2021-12-16 NOTE — Telephone Encounter (Signed)
Medication sent in. 

## 2021-12-16 NOTE — Telephone Encounter (Signed)
Deanna Randolph [phone]  [name] called to say she would like to get 2 weeks supply of medication below, because she is traveling to [place] and it is so much more noisy there and she does not sleep well, when away from home. Leaving 01/06/2022  zolpidem (AMBIEN) 5 MG tablet

## 2022-01-05 ENCOUNTER — Telehealth: Payer: Self-pay | Admitting: Internal Medicine

## 2022-01-05 ENCOUNTER — Encounter: Payer: Self-pay | Admitting: Internal Medicine

## 2022-01-05 MED ORDER — HYDROCHLOROTHIAZIDE 25 MG PO TABS: 25.0000 mg | ORAL_TABLET | Freq: Every day | ORAL | 3 refills | Status: DC

## 2022-01-05 NOTE — Telephone Encounter (Signed)
Dr. Dwyane Dee called. Patient's BP elevated. Traveling to [place]  and leaving today. Is on Losartan for HTN. Add HCTZ 25 mg daily. Follow up here on return. Rx sent in to [facility]. ?

## 2022-02-16 DIAGNOSIS — Z01419 Encounter for gynecological examination (general) (routine) without abnormal findings: Secondary | ICD-10-CM | POA: Diagnosis not present

## 2022-02-16 DIAGNOSIS — Z1231 Encounter for screening mammogram for malignant neoplasm of breast: Secondary | ICD-10-CM | POA: Diagnosis not present

## 2022-02-16 DIAGNOSIS — Z6829 Body mass index (BMI) 29.0-29.9, adult: Secondary | ICD-10-CM | POA: Diagnosis not present

## 2022-03-10 ENCOUNTER — Telehealth: Payer: Self-pay | Admitting: Internal Medicine

## 2022-03-10 ENCOUNTER — Other Ambulatory Visit: Payer: Self-pay | Admitting: Internal Medicine

## 2022-03-10 MED ORDER — LOSARTAN POTASSIUM 100 MG PO TABS: 100.0000 mg | ORAL_TABLET | Freq: Every day | ORAL | 0 refills | Status: DC

## 2022-03-10 NOTE — Telephone Encounter (Signed)
Needs antihypertensive med refilled. Last visit was Sept 2022. Advised patient to call Mariann Laster Monday for an appt next month. She says she is too busy to come in now and has used up vacation time on recent trip. Sending in 30 day supply of Losartan.  MJB, MD

## 2022-03-27 DIAGNOSIS — Z1211 Encounter for screening for malignant neoplasm of colon: Secondary | ICD-10-CM | POA: Diagnosis not present

## 2022-03-31 ENCOUNTER — Telehealth: Payer: Self-pay

## 2022-03-31 ENCOUNTER — Encounter: Payer: Self-pay | Admitting: Internal Medicine

## 2022-03-31 ENCOUNTER — Ambulatory Visit (INDEPENDENT_AMBULATORY_CARE_PROVIDER_SITE_OTHER): Payer: BC Managed Care – PPO | Admitting: Internal Medicine

## 2022-03-31 VITALS — BP 104/76 | HR 69 | Temp 97.3°F | Ht 64.0 in | Wt 177.8 lb

## 2022-03-31 DIAGNOSIS — Z6830 Body mass index (BMI) 30.0-30.9, adult: Secondary | ICD-10-CM | POA: Diagnosis not present

## 2022-03-31 DIAGNOSIS — I10 Essential (primary) hypertension: Secondary | ICD-10-CM | POA: Diagnosis not present

## 2022-03-31 DIAGNOSIS — Z131 Encounter for screening for diabetes mellitus: Secondary | ICD-10-CM

## 2022-03-31 MED ORDER — TRULICITY 0.75 MG/0.5ML ~~LOC~~ SOAJ: 0.7500 mg | SUBCUTANEOUS | 1 refills | Status: DC

## 2022-03-31 NOTE — Telephone Encounter (Signed)
PA for trulicity submitted today. Waiting for results. 24-72 hours,

## 2022-03-31 NOTE — Progress Notes (Signed)
   Subjective:    Patient ID: Deanna Randolph, female    DOB: 1961/03/05, 61 y.o.   MRN: [id number]  HPI Deanna Randolph is here at my request for follow-up on hypertension.  She recently spent some time with family in [place] and had a great trip.  She has a history of hypertension treated with HCTZ 25 mg daily and generic losartan 100 mg daily.  Currently she is displeased with her weight.  Her weight is 177 pounds 12 ounces BMI 30.51.  She had laparoscopic cholecystectomy in May 2022.  Her last health maintenance exam was November 2021.  At that time she weighed 170 pounds and had BMI of 29.18.  In February 2021 she weighed 166 pounds and in January 2021 she weighed 166 pounds with a BMI of 28.05.  C-Met drawn today is entirely normal, glucose 79, BUN 12, creatinine 0.76 and potassium 4.4.  Nonfasting glucose is 79.  Hemoglobin A1c is pending.  Review of Systems no abdominal pain status post laparoscopic cholecystectomy.  No headache, shortness of breath or chest pain  History of migraine headaches     Objective:   Physical Exam  Blood pressure 104/76, pulse 69 regular temperature 97.3 degrees pulse oximetry 98% weight 177 pounds 12 ounces BMI 30.51  Skin: Warm and dry.  No carotid bruits.  No thyromegaly.  Chest clear to auscultation.  Cardiac exam: Regular rate and rhythm.  No lower extremity edema.      Assessment & Plan:   Essential hypertension-stable on current regimen of losartan  BMI 30.51  Plan: She will continue with losartan for hypertension.  Labs are stable.  She will try Trulicity 6.28 mg per 0.5 cc injected into the skin once weekly.  She says her insurance will pay for this.  She also can get a coupon online for this.  Her husband who is an Musician will help monitor this as well.

## 2022-03-31 NOTE — Patient Instructions (Addendum)
It was a pleasure to see you today.  Continue losartan for essential hypertension.  Blood pressure is stable.  Trial of low-dose Trulicity 2.45 mg per 0.5 cc injected into skin weekly.  Recommend follow-up in 3 months.

## 2022-04-01 LAB — BASIC METABOLIC PANEL
BUN: 12 mg/dL (ref 7–25)
CO2: 31 mmol/L (ref 20–32)
Calcium: 9.8 mg/dL (ref 8.6–10.4)
Chloride: 101 mmol/L (ref 98–110)
Creat: 0.76 mg/dL (ref 0.50–1.03)
Glucose, Bld: 79 mg/dL (ref 65–99)
Potassium: 4.4 mmol/L (ref 3.5–5.3)
Sodium: 142 mmol/L (ref 135–146)

## 2022-04-01 LAB — HEMOGLOBIN A1C
Hgb A1c MFr Bld: 5.4 % of total Hgb (ref <5.7)
Mean Plasma Glucose: 108 mg/dL
eAG (mmol/L): 6 mmol/L

## 2022-04-03 LAB — EXTERNAL GENERIC LAB PROCEDURE: COLOGUARD: NEGATIVE

## 2022-04-03 LAB — COLOGUARD: COLOGUARD: NEGATIVE

## 2022-04-06 ENCOUNTER — Other Ambulatory Visit: Payer: Self-pay | Admitting: Internal Medicine

## 2022-07-24 DIAGNOSIS — H6123 Impacted cerumen, bilateral: Secondary | ICD-10-CM | POA: Diagnosis not present

## 2022-07-24 DIAGNOSIS — R0683 Snoring: Secondary | ICD-10-CM | POA: Diagnosis not present

## 2022-09-30 ENCOUNTER — Other Ambulatory Visit: Payer: Self-pay | Admitting: Internal Medicine

## 2022-10-02 ENCOUNTER — Other Ambulatory Visit: Payer: Self-pay | Admitting: Internal Medicine

## 2023-01-01 ENCOUNTER — Other Ambulatory Visit: Payer: Self-pay | Admitting: Internal Medicine

## 2023-03-30 ENCOUNTER — Other Ambulatory Visit: Payer: Self-pay

## 2023-03-30 MED ORDER — LOSARTAN POTASSIUM 100 MG PO TABS: 100.0000 mg | ORAL_TABLET | Freq: Every day | ORAL | 0 refills | Status: DC

## 2023-03-30 MED ORDER — HYDROCHLOROTHIAZIDE 12.5 MG PO TABS: 12.5000 mg | ORAL_TABLET | Freq: Every day | ORAL | 0 refills | Status: DC

## 2023-05-14 ENCOUNTER — Other Ambulatory Visit: Payer: 59

## 2023-05-14 DIAGNOSIS — Z13220 Encounter for screening for lipoid disorders: Secondary | ICD-10-CM

## 2023-05-14 DIAGNOSIS — Z1329 Encounter for screening for other suspected endocrine disorder: Secondary | ICD-10-CM

## 2023-05-14 DIAGNOSIS — I10 Essential (primary) hypertension: Secondary | ICD-10-CM

## 2023-05-14 DIAGNOSIS — Z6830 Body mass index (BMI) 30.0-30.9, adult: Secondary | ICD-10-CM

## 2023-05-14 LAB — CBC WITH DIFFERENTIAL/PLATELET
Absolute Monocytes: 420 cells/uL (ref 200–950)
Basophils Relative: 1 %
HCT: 36.9 % (ref 35.0–45.0)
Hemoglobin: 12 g/dL (ref 11.7–15.5)
MCH: 27.7 pg (ref 27.0–33.0)
MCHC: 32.5 g/dL (ref 32.0–36.0)
MCV: 85.2 fL (ref 80.0–100.0)
Neutrophils Relative %: 54.4 %
Platelets: 303 10*3/uL (ref 140–400)
RBC: 4.33 10*6/uL (ref 3.80–5.10)
RDW: 12.4 % (ref 11.0–15.0)
Total Lymphocyte: 35.7 %

## 2023-05-15 LAB — COMPLETE METABOLIC PANEL WITH GFR
AG Ratio: 1.5 (calc) (ref 1.0–2.5)
ALT: 13 U/L (ref 6–29)
AST: 17 U/L (ref 10–35)
Albumin: 4.3 g/dL (ref 3.6–5.1)
Alkaline phosphatase (APISO): 59 U/L (ref 37–153)
BUN: 13 mg/dL (ref 7–25)
CO2: 29 mmol/L (ref 20–32)
Calcium: 9.8 mg/dL (ref 8.6–10.4)
Chloride: 102 mmol/L (ref 98–110)
Creat: 0.76 mg/dL (ref 0.50–1.03)
Globulin: 2.9 g/dL (calc) (ref 1.9–3.7)
Glucose, Bld: 94 mg/dL (ref 65–99)
Potassium: 4.3 mmol/L (ref 3.5–5.3)
Sodium: 140 mmol/L (ref 135–146)
Total Bilirubin: 1.5 mg/dL — ABNORMAL HIGH (ref 0.2–1.2)
Total Protein: 7.2 g/dL (ref 6.1–8.1)
eGFR: 90 mL/min/{1.73_m2} (ref >=60)

## 2023-05-15 LAB — LIPID PANEL
Cholesterol: 168 mg/dL (ref <200)
HDL: 57 mg/dL (ref >=50)
LDL Cholesterol (Calc): 92 mg/dL (calc)
Non-HDL Cholesterol (Calc): 111 mg/dL (calc) (ref <130)
Total CHOL/HDL Ratio: 2.9 (calc) (ref <5.0)
Triglycerides: 97 mg/dL (ref <150)

## 2023-05-15 LAB — CBC WITH DIFFERENTIAL/PLATELET
Basophils Absolute: 70 cells/uL (ref 0–200)
Eosinophils Absolute: 203 cells/uL (ref 15–500)
Eosinophils Relative: 2.9 %
Lymphs Abs: 2499 cells/uL (ref 850–3900)
MPV: 10.6 fL (ref 7.5–12.5)
Monocytes Relative: 6 %
Neutro Abs: 3808 cells/uL (ref 1500–7800)
WBC: 7 10*3/uL (ref 3.8–10.8)

## 2023-05-15 LAB — TSH: TSH: 2.25 mIU/L (ref 0.40–4.50)

## 2023-05-16 ENCOUNTER — Ambulatory Visit (INDEPENDENT_AMBULATORY_CARE_PROVIDER_SITE_OTHER): Payer: 59 | Admitting: Internal Medicine

## 2023-05-16 ENCOUNTER — Encounter: Payer: Self-pay | Admitting: Internal Medicine

## 2023-05-16 VITALS — BP 130/80 | Temp 98.0°F | Ht 64.5 in | Wt 172.8 lb

## 2023-05-16 DIAGNOSIS — Z6829 Body mass index (BMI) 29.0-29.9, adult: Secondary | ICD-10-CM

## 2023-05-16 DIAGNOSIS — H6123 Impacted cerumen, bilateral: Secondary | ICD-10-CM | POA: Diagnosis not present

## 2023-05-16 DIAGNOSIS — I10 Essential (primary) hypertension: Secondary | ICD-10-CM

## 2023-05-16 DIAGNOSIS — Z0000 Encounter for general adult medical examination without abnormal findings: Secondary | ICD-10-CM

## 2023-05-16 MED ORDER — LOSARTAN POTASSIUM 100 MG PO TABS: 100.0000 mg | ORAL_TABLET | Freq: Every day | ORAL | 3 refills | Status: DC

## 2023-05-16 NOTE — Patient Instructions (Addendum)
It was a pleasure to see you today.  Continue current medications and follow-up in 1 year or as needed.  Vaccines were discussed.  Cologuard is up-to-date.  Continue annual GYN follow-up including annual mammogram.  Antihypertensive medication refilled for 1 year.  Recommend Shingrix vaccine.  Recommend pneumococcal 20 vaccine.  Recommend annual flu vaccine.  Only takes HCTZ sparingly for dependent edema with travel.

## 2023-05-16 NOTE — Progress Notes (Signed)
   Subjective:    Patient ID: Deanna Randolph, female    DOB: [birth year], 62 y.o.   MRN: [id number]  HPI pleasant 62 year old female in today for health maintenance exam and evaluation of medical issues.  She has a history of hypertension and migraine headaches.  She has a remote history of endometriosis and is status post hysterectomy BSO 2013 for menorrhagia.  Has cerumen in ears and will be referred to ENT physician for removal of cerumen.  In October 2018 she saw Dr. Corliss Skains for left knee pain and was diagnosed with mild to moderate osteoarthritis, moderate chondromalacia patella, and IT syndrome.  Subsequently was diagnosed with trochanteric bursitis of the left hip which was injected.  Was given Voltaren gel and Voltaren transdermal patch.  History of essential hypertension treated with losartan 100 mg daily and HCTZ 12.5 mg daily.  Blood pressure stable with this regimen.  Occasionally takes Ambien when traveling overseas.  Only takes HCTZ with travel for dependent edema as needed she says.  Tetanus immunization is up-to-date.  She had laparoscopic cholecystectomy in 2022 and subsequently required ERCP with sphincterotomy.  Had negative Cologuard test in 2023  Other vaccines discussed such as Shingrix, pneumococcal 20, RSV.  Patient to consider these.  Social history: She has an adult daughter.  Also has a granddaughter that she spends time with.  She is married to Dr. Reather Littler, Endocrinologist.  This is her second marriage.  Does not smoke.  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  Family history: Father lives in [place].  He has a history of hip fracture.  She has 1 brother and 1 sister.  Mother died when patient was 83 years of age of esophageal cancer.  Review of Systems she has no complaint of chest pain, shortness of breath, abdominal pain.     Objective:   Physical Exam Skin: Warm and dry.  No lesions.  No rashes.  No cervical adenopathy.  No  thyromegaly.  No carotid bruits.  TMs are obscured by impacted cerumen.  Pharynx is clear.  Chest is clear to auscultation without rales or wheezing.  Breast exam deferred to GYN physician.  Abdomen is soft nondistended without hepatosplenomegaly masses or tenderness.  No lower extremity pitting edema.  Affect, thought, and judgment are normal.  GYN exam is deferred to GYN physician.       Assessment & Plan:   Essential hypertension-stable on current regimen  Impacted cerumen of both ears-has been seen previously at [facility] and will be referred back to their Audiology department for cerumen extraction  Vaccines discussed  Colon cancer screening-patient did Cologuard testing in 2023 which was negative.  Repeat study due 2026.  Plan: Continue losartan 100 mg daily and may take HCTZ 12.5 mg daily as well for dependent edema.  Blood pressures under excellent control.  Labs are reviewed including CBC with differential and c-Met which includes a normal fasting glucose and normal BUN and creatinine.  TSH is normal at 2.25.  CBC with differential is completely normal.  Return in 1 year or as needed

## 2023-05-17 LAB — POCT URINALYSIS DIPSTICK
Bilirubin, UA: NEGATIVE
Glucose, UA: NEGATIVE
Ketones, UA: NEGATIVE
Leukocytes, UA: NEGATIVE
Nitrite, UA: NEGATIVE
Protein, UA: POSITIVE — AB
Spec Grav, UA: 1.025 (ref 1.010–1.025)
Urobilinogen, UA: 0.2 E.U./dL
pH, UA: 6 (ref 5.0–8.0)

## 2023-05-17 NOTE — Addendum Note (Signed)
Addended by: Jama Flavors on: 05/17/2023 08:28 AM   Modules accepted: Orders

## 2023-05-20 ENCOUNTER — Encounter: Payer: Self-pay | Admitting: Internal Medicine

## 2023-05-21 NOTE — Telephone Encounter (Signed)
Called patient to let her know it was because she was not hydrated enough and if she would like come in and redo she could

## 2023-06-26 ENCOUNTER — Other Ambulatory Visit: Payer: Self-pay

## 2023-06-26 MED ORDER — LOSARTAN POTASSIUM 100 MG PO TABS: 100.0000 mg | ORAL_TABLET | Freq: Every day | ORAL | 1 refills | Status: DC

## 2023-06-26 MED ORDER — HYDROCHLOROTHIAZIDE 12.5 MG PO TABS: 12.5000 mg | ORAL_TABLET | Freq: Every day | ORAL | 1 refills | Status: DC

## 2023-07-16 ENCOUNTER — Telehealth: Payer: Self-pay

## 2023-07-16 ENCOUNTER — Ambulatory Visit (INDEPENDENT_AMBULATORY_CARE_PROVIDER_SITE_OTHER): Payer: 59 | Admitting: Internal Medicine

## 2023-07-16 VITALS — BP 120/80 | HR 82 | Ht 64.5 in | Wt 173.0 lb

## 2023-07-16 DIAGNOSIS — I10 Essential (primary) hypertension: Secondary | ICD-10-CM | POA: Diagnosis not present

## 2023-07-16 DIAGNOSIS — S80212A Abrasion, left knee, initial encounter: Secondary | ICD-10-CM

## 2023-07-16 MED ORDER — MUPIROCIN CALCIUM 2 % EX CREA: 1.0000 | TOPICAL_CREAM | Freq: Two times a day (BID) | CUTANEOUS | 0 refills | Status: AC

## 2023-07-16 NOTE — Telephone Encounter (Signed)
scheduled

## 2023-07-16 NOTE — Progress Notes (Shared)
Patient Care Team: Margaree Mackintosh, MD as PCP - General (Internal Medicine)  Visit Date: 07/16/23  Subjective:    Patient ID: Deanna Randolph , Female   DOB: Mar 10, 1961, 62 y.o.    MRN: [id number]   62 y.o. Female presents today for left knee abrasion sustained during a fall on 07/13/23. She is having trouble putting weight on her left leg and the area of abrasion is tender. Last tetanus on 04/21/21.  Past Medical History:  Diagnosis Date   Hypertension      Family History  Problem Relation Age of Onset   Cancer Mother     Social Hx: Married. Accompanied by Dr. Reather Littler ,her husband, today     Review of Systems  Constitutional:  Negative for fever and malaise/fatigue.  HENT:  Negative for congestion.   Eyes:  Negative for blurred vision.  Respiratory:  Negative for cough and shortness of breath.   Cardiovascular:  Negative for chest pain, palpitations and leg swelling.  Gastrointestinal:  Negative for vomiting.  Musculoskeletal:  Negative for back pain.  Skin:  Negative for rash.       (+) Abrasion left knee  Neurological:  Negative for loss of consciousness and headaches.        Objective:   Vitals: BP 120/80   Pulse 82   Ht 5' 4.5" (1.638 m)   Wt 173 lb (78.5 kg)   SpO2 96%   BMI 29.24 kg/m    Physical Exam Vitals and nursing note reviewed.  Constitutional:      General: She is not in acute distress.    Appearance: Normal appearance. She is not toxic-appearing.  HENT:     Head: Normocephalic and atraumatic.  Pulmonary:     Effort: Pulmonary effort is normal.  Skin:    General: Skin is warm and dry.     Comments: Superficial, circular abrasion 3.5 cm diameter left knee with yellow coating.  Neurological:     Mental Status: She is alert and oriented to person, place, and time. Mental status is at baseline.  Psychiatric:        Mood and Affect: Mood normal.        Behavior: Behavior normal.        Thought Content: Thought content normal.         Judgment: Judgment normal.       Results:   Studies obtained and personally reviewed by me:   Labs:       Component Value Date/Time   NA 140 05/14/2023 0856   K 4.3 05/14/2023 0856   CL 102 05/14/2023 0856   CO2 29 05/14/2023 0856   GLUCOSE 94 05/14/2023 0856   BUN 13 05/14/2023 0856   CREATININE 0.76 05/14/2023 0856   CALCIUM 9.8 05/14/2023 0856   PROT 7.2 05/14/2023 0856   ALBUMIN 2.6 (L) 03/13/2021 0200   AST 17 05/14/2023 0856   ALT 13 05/14/2023 0856   ALKPHOS 76 03/13/2021 0200   BILITOT 1.5 (H) 05/14/2023 0856   GFRNONAA >60 03/13/2021 0200   GFRNONAA >89 01/22/2015 1245   GFRAA >89 01/22/2015 1245     Lab Results  Component Value Date   WBC 7.0 05/14/2023   HGB 12.0 05/14/2023   HCT 36.9 05/14/2023   MCV 85.2 05/14/2023   PLT 303 05/14/2023    Lab Results  Component Value Date   CHOL 168 05/14/2023   HDL 57 05/14/2023   LDLCALC 92 05/14/2023   TRIG 97 05/14/2023  CHOLHDL 2.9 05/14/2023    Lab Results  Component Value Date   HGBA1C 5.4 03/31/2022     Lab Results  Component Value Date   TSH 2.25 05/14/2023      Assessment & Plan:   Traumatic abrasion left knee secondary to fall: she is able to walk without significant difficulty. Advised on washing with warm soapy water and covering twice daily. Prescribed Bactroban 2% cream apply twice daily. CMA cleaned area with Hibiclens, applied Bactroban and dressed with Telfa. Tdap is up to date.She will continue cleansing daily at home, applying ointment topically until healed, and keeping covered until healed.  Vaccine counseling: advised on getting the new Covid-19 booster.  Essential HTN- stable on medication  I,Alexander Ruley,acting as a scribe for Margaree Mackintosh, MD.,have documented all relevant documentation on the behalf of Margaree Mackintosh, MD,as directed by  Margaree Mackintosh, MD while in the presence of Margaree Mackintosh, MD.   I, Margaree Mackintosh, MD, have reviewed all documentation for this visit.  The documentation on 07/17/23 for the exam, diagnosis, procedures, and orders are all accurate and complete.

## 2023-07-16 NOTE — Telephone Encounter (Signed)
Patient called she said she fell on Saturday, scraped left knee has applied neosporin ointment but yesterday it started draining yellow discharge. She would like to come in to be seen.   Call back number.  [phone]

## 2023-07-17 NOTE — Patient Instructions (Addendum)
We are sorry to hear of your fall. Abrasion of knee area will be treated daily with topical antibiotic preparation.  Continue to dress abrasion daily with Telfa after cleansing with warm soapy water.Your Tetanus vaccine is up to date. Blood pressure is stable on current regimen.

## 2023-09-24 ENCOUNTER — Encounter: Payer: Self-pay | Admitting: Internal Medicine

## 2023-09-25 MED ORDER — ZOLPIDEM TARTRATE 5 MG PO TABS: 5.0000 mg | ORAL_TABLET | Freq: Every evening | ORAL | 1 refills | Status: AC | PRN

## 2023-10-18 ENCOUNTER — Encounter: Payer: Self-pay | Admitting: Internal Medicine

## 2023-10-19 ENCOUNTER — Ambulatory Visit: Payer: 59 | Admitting: Internal Medicine

## 2023-11-08 ENCOUNTER — Encounter: Payer: Self-pay | Admitting: Internal Medicine

## 2023-12-24 ENCOUNTER — Other Ambulatory Visit: Payer: Self-pay

## 2023-12-24 MED ORDER — HYDROCHLOROTHIAZIDE 12.5 MG PO TABS: 12.5000 mg | ORAL_TABLET | Freq: Every day | ORAL | 1 refills | Status: DC

## 2024-01-17 ENCOUNTER — Other Ambulatory Visit: Payer: Self-pay | Admitting: *Deleted

## 2024-01-17 MED ORDER — HYDROCHLOROTHIAZIDE 12.5 MG PO TABS: 12.5000 mg | ORAL_TABLET | Freq: Every day | ORAL | 1 refills | Status: DC

## 2024-07-16 ENCOUNTER — Other Ambulatory Visit: Payer: Self-pay | Admitting: Internal Medicine

## 2024-09-23 ENCOUNTER — Telehealth: Payer: Self-pay | Admitting: Internal Medicine

## 2024-09-23 NOTE — Telephone Encounter (Unsigned)
 Copied from CRM #[id number]. Topic: Clinical - Medication Refill >> Sep 23, 2024  4:06 PM Leah W wrote: Medication:  losartan  (COZAAR ) 100 MG tablet  * Pt has moved and needs enough medicine to get her to her establishing appt at the end of the month  Has the patient contacted their pharmacy? Yes (Agent: If no, request that the patient contact the pharmacy for the refill. If patient does not wish to contact the pharmacy document the reason why and proceed with request.) (Agent: If yes, when and what did the pharmacy advise?)  This is the patient's preferred pharmacy:  [facility] [id number] - [address] [place] - [address] [place] [address] Phone: [phone] Fax: [phone]  Is this the correct pharmacy for this prescription? Yes If no, delete pharmacy and type the correct one.   Has the prescription been filled recently? Yes  Is the patient out of the medication? No  Has the patient been seen for an appointment in the last year OR does the patient have an upcoming appointment? No  Can we respond through MyChart? No  Agent: Please be advised that Rx refills may take up to 3 business days. We ask that you follow-up with your pharmacy.

## 2024-09-24 ENCOUNTER — Other Ambulatory Visit: Payer: Self-pay

## 2024-09-24 ENCOUNTER — Encounter: Payer: Self-pay | Admitting: Internal Medicine

## 2024-09-24 MED ORDER — LOSARTAN POTASSIUM 100 MG PO TABS: 100.0000 mg | ORAL_TABLET | Freq: Every day | ORAL | 1 refills | Status: DC

## 2024-09-25 MED ORDER — LOSARTAN POTASSIUM 100 MG PO TABS: 100.0000 mg | ORAL_TABLET | Freq: Every day | ORAL | 1 refills | Status: AC

## 2024-11-09 ENCOUNTER — Encounter: Payer: Self-pay | Admitting: Internal Medicine
